# Patient Record
Sex: Male | Born: 1975 | Race: Black or African American | Hispanic: No | Marital: Married | Smoking: Never smoker
Health system: Southern US, Community
[De-identification: ages and names within clinical notes are randomized; demographics above are authoritative.]

## PROBLEM LIST (undated history)

## (undated) DIAGNOSIS — I10 Essential (primary) hypertension: Secondary | ICD-10-CM

## (undated) DIAGNOSIS — I509 Heart failure, unspecified: Secondary | ICD-10-CM

## (undated) DIAGNOSIS — E119 Type 2 diabetes mellitus without complications: Secondary | ICD-10-CM

---

## 2019-08-04 ENCOUNTER — Emergency Department (HOSPITAL_COMMUNITY): Payer: BLUE CROSS/BLUE SHIELD

## 2019-08-04 ENCOUNTER — Inpatient Hospital Stay (HOSPITAL_COMMUNITY)
Admission: EM | Admit: 2019-08-04 | Discharge: 2019-08-11 | DRG: 699 | Disposition: A | Discharge status: Home / Self Care | Payer: BLUE CROSS/BLUE SHIELD | Attending: Internal Medicine | Admitting: Internal Medicine

## 2019-08-04 ENCOUNTER — Inpatient Hospital Stay (HOSPITAL_COMMUNITY): Payer: BLUE CROSS/BLUE SHIELD

## 2019-08-04 ENCOUNTER — Encounter (HOSPITAL_COMMUNITY): Payer: Self-pay | Admitting: Emergency Medicine

## 2019-08-04 ENCOUNTER — Other Ambulatory Visit: Payer: Self-pay

## 2019-08-04 DIAGNOSIS — E669 Obesity, unspecified: Secondary | ICD-10-CM | POA: Diagnosis present

## 2019-08-04 DIAGNOSIS — Z833 Family history of diabetes mellitus: Secondary | ICD-10-CM

## 2019-08-04 DIAGNOSIS — E1121 Type 2 diabetes mellitus with diabetic nephropathy: Principal | ICD-10-CM | POA: Diagnosis present

## 2019-08-04 DIAGNOSIS — E876 Hypokalemia: Secondary | ICD-10-CM | POA: Diagnosis not present

## 2019-08-04 DIAGNOSIS — E8770 Fluid overload, unspecified: Secondary | ICD-10-CM

## 2019-08-04 DIAGNOSIS — I5032 Chronic diastolic (congestive) heart failure: Secondary | ICD-10-CM | POA: Diagnosis present

## 2019-08-04 DIAGNOSIS — I110 Hypertensive heart disease with heart failure: Secondary | ICD-10-CM | POA: Diagnosis present

## 2019-08-04 DIAGNOSIS — Z8249 Family history of ischemic heart disease and other diseases of the circulatory system: Secondary | ICD-10-CM

## 2019-08-04 DIAGNOSIS — I509 Heart failure, unspecified: Secondary | ICD-10-CM

## 2019-08-04 DIAGNOSIS — Z8616 Personal history of COVID-19: Secondary | ICD-10-CM

## 2019-08-04 DIAGNOSIS — I493 Ventricular premature depolarization: Secondary | ICD-10-CM | POA: Diagnosis not present

## 2019-08-04 DIAGNOSIS — E118 Type 2 diabetes mellitus with unspecified complications: Secondary | ICD-10-CM

## 2019-08-04 DIAGNOSIS — N049 Nephrotic syndrome with unspecified morphologic changes: Secondary | ICD-10-CM

## 2019-08-04 DIAGNOSIS — M7989 Other specified soft tissue disorders: Secondary | ICD-10-CM | POA: Diagnosis present

## 2019-08-04 DIAGNOSIS — Z20822 Contact with and (suspected) exposure to covid-19: Secondary | ICD-10-CM | POA: Diagnosis present

## 2019-08-04 DIAGNOSIS — Z6841 Body Mass Index (BMI) 40.0 and over, adult: Secondary | ICD-10-CM | POA: Diagnosis not present

## 2019-08-04 HISTORY — DX: Essential (primary) hypertension: I10

## 2019-08-04 HISTORY — DX: Heart failure, unspecified: I50.9

## 2019-08-04 HISTORY — DX: Type 2 diabetes mellitus without complications: E11.9

## 2019-08-04 LAB — BASIC METABOLIC PANEL
Anion gap: 11 (ref 5–15)
BUN: 14 mg/dL (ref 6–20)
CO2: 25 mmol/L (ref 22–32)
Calcium: 8.5 mg/dL — ABNORMAL LOW (ref 8.9–10.3)
Chloride: 103 mmol/L (ref 98–111)
Creatinine, Ser: 1.3 mg/dL — ABNORMAL HIGH (ref 0.61–1.24)
GFR calc Af Amer: >60 mL/min (ref >60)
GFR calc non Af Amer: >60 mL/min (ref >60)
Glucose, Bld: 259 mg/dL — ABNORMAL HIGH (ref 70–99)
Potassium: 3.5 mmol/L (ref 3.5–5.1)
Sodium: 139 mmol/L (ref 135–145)

## 2019-08-04 LAB — URINALYSIS, ROUTINE W REFLEX MICROSCOPIC
Bacteria, UA: NONE SEEN
Bilirubin Urine: NEGATIVE
Glucose, UA: 50 mg/dL — AB
Ketones, ur: NEGATIVE mg/dL
Leukocytes,Ua: NEGATIVE
Nitrite: NEGATIVE
Protein, ur: >=300 mg/dL — AB
Specific Gravity, Urine: 1.009 (ref 1.005–1.030)
pH: 5 (ref 5.0–8.0)

## 2019-08-04 LAB — SARS CORONAVIRUS 2 (TAT 6-24 HRS): SARS Coronavirus 2: NEGATIVE

## 2019-08-04 LAB — LIPID PANEL
Cholesterol: 240 mg/dL — ABNORMAL HIGH (ref 0–200)
HDL: 48 mg/dL (ref >40)
LDL Cholesterol: 176 mg/dL — ABNORMAL HIGH (ref 0–99)
Total CHOL/HDL Ratio: 5 RATIO
Triglycerides: 79 mg/dL (ref <150)
VLDL: 16 mg/dL (ref 0–40)

## 2019-08-04 LAB — GLUCOSE, CAPILLARY
Glucose-Capillary: 237 mg/dL — ABNORMAL HIGH (ref 70–99)
Glucose-Capillary: 230 mg/dL — ABNORMAL HIGH (ref 70–99)

## 2019-08-04 LAB — CBC
HCT: 40.8 % (ref 39.0–52.0)
Hemoglobin: 12.6 g/dL — ABNORMAL LOW (ref 13.0–17.0)
MCH: 25.3 pg — ABNORMAL LOW (ref 26.0–34.0)
MCHC: 30.9 g/dL (ref 30.0–36.0)
MCV: 81.8 fL (ref 80.0–100.0)
Platelets: 288 K/uL (ref 150–400)
RBC: 4.99 MIL/uL (ref 4.22–5.81)
RDW: 13.2 % (ref 11.5–15.5)
WBC: 8.8 K/uL (ref 4.0–10.5)
nRBC: 0 % (ref 0.0–0.2)

## 2019-08-04 LAB — HEPATIC FUNCTION PANEL
ALT: 45 U/L — ABNORMAL HIGH (ref 0–44)
AST: 30 U/L (ref 15–41)
Albumin: 2.4 g/dL — ABNORMAL LOW (ref 3.5–5.0)
Alkaline Phosphatase: 69 U/L (ref 38–126)
Bilirubin, Direct: 0.1 mg/dL (ref 0.0–0.2)
Indirect Bilirubin: 0.7 mg/dL (ref 0.3–0.9)
Total Bilirubin: 0.8 mg/dL (ref 0.3–1.2)
Total Protein: 6.2 g/dL — ABNORMAL LOW (ref 6.5–8.1)

## 2019-08-04 LAB — PROTEIN / CREATININE RATIO, URINE
Creatinine, Urine: 51.89 mg/dL
Protein Creatinine Ratio: 5.34 mg/mg{Cre} — ABNORMAL HIGH (ref 0.00–0.15)
Total Protein, Urine: 277 mg/dL

## 2019-08-04 LAB — BRAIN NATRIURETIC PEPTIDE: B Natriuretic Peptide: 1264.4 pg/mL — ABNORMAL HIGH (ref 0.0–100.0)

## 2019-08-04 LAB — HIV ANTIBODY (ROUTINE TESTING W REFLEX): HIV Screen 4th Generation wRfx: NONREACTIVE

## 2019-08-04 LAB — ECHOCARDIOGRAM COMPLETE

## 2019-08-04 MED ORDER — ATORVASTATIN CALCIUM 40 MG PO TABS
40.0000 mg | ORAL_TABLET | Freq: Every day | ORAL | Status: DC
  Administered 2019-08-04 – 2019-08-11 (×8): 40 mg via ORAL
  Filled 2019-08-04 (×8): qty 1

## 2019-08-04 MED ORDER — FUROSEMIDE 10 MG/ML IJ SOLN
40.0000 mg | Freq: Every day | INTRAMUSCULAR | Status: DC
  Administered 2019-08-05: 40 mg via INTRAVENOUS
  Filled 2019-08-04: qty 4

## 2019-08-04 MED ORDER — FUROSEMIDE 10 MG/ML IJ SOLN
40.0000 mg | Freq: Once | INTRAMUSCULAR | Status: AC
  Administered 2019-08-04: 40 mg via INTRAVENOUS
  Filled 2019-08-04: qty 4

## 2019-08-04 MED ORDER — INSULIN ASPART 100 UNIT/ML ~~LOC~~ SOLN
0.0000 [IU] | Freq: Three times a day (TID) | SUBCUTANEOUS | Status: DC
  Administered 2019-08-05: 17:00:00 5 [IU] via SUBCUTANEOUS
  Administered 2019-08-05 – 2019-08-07 (×8): 3 [IU] via SUBCUTANEOUS
  Administered 2019-08-08: 5 [IU] via SUBCUTANEOUS
  Administered 2019-08-08: 3 [IU] via SUBCUTANEOUS
  Administered 2019-08-08: 5 [IU] via SUBCUTANEOUS
  Administered 2019-08-09 (×2): 3 [IU] via SUBCUTANEOUS
  Administered 2019-08-09: 5 [IU] via SUBCUTANEOUS
  Administered 2019-08-10: 2 [IU] via SUBCUTANEOUS
  Administered 2019-08-10: 3 [IU] via SUBCUTANEOUS
  Administered 2019-08-11: 5 [IU] via SUBCUTANEOUS
  Administered 2019-08-11: 3 [IU] via SUBCUTANEOUS

## 2019-08-04 MED ORDER — ENOXAPARIN SODIUM 40 MG/0.4ML ~~LOC~~ SOLN
40.0000 mg | SUBCUTANEOUS | Status: DC
  Administered 2019-08-04 – 2019-08-05 (×2): 40 mg via SUBCUTANEOUS
  Filled 2019-08-04 (×2): qty 0.4

## 2019-08-04 MED ORDER — FUROSEMIDE 10 MG/ML IJ SOLN
40.0000 mg | Freq: Two times a day (BID) | INTRAMUSCULAR | Status: DC
  Filled 2019-08-04: qty 4

## 2019-08-04 NOTE — ED Notes (Signed)
Lab called for hepatic function add on

## 2019-08-04 NOTE — ED Provider Notes (Signed)
[facility] EMERGENCY DEPARTMENT Provider Note   CSN: [id number] Arrival date & time: 08/04/19  0216     History Chief Complaint  Patient presents with  . Leg Swelling  . Groin Swelling    Edward Page is a 44 y.o. male.  HPI 44 year old male presents with leg swelling and scrotal swelling.  The leg swelling started about a week ago.  Scrotal swelling started 2 days ago.  He went to [facility] ER where he was discharged with 40 mg Lasix.  Has taken this yesterday and the day before.  Scrotal swelling got worse so he went back to [facility] and was told that his heart was enlarged and had a negative scrotal ultrasound.  However the swelling seems to be worse and he is unable to walk because of the scrotal swelling.  He has been feeling short of breath since he got Covid back in November 2020 but does not think his shortness of breath is particularly worse.  He has a hard time ambulating at baseline but now it is essentially gone because of the swelling.  Chart indicates a history of congestive heart failure but he states that he was told he might have this a couple days ago in the ER but has never had an official diagnosis.   Past Medical History:  Diagnosis Date  . CHF (congestive heart failure) (HCC)   . Diabetes mellitus without complication (HCC)   . Hypertension     There are no problems to display for this patient.   History reviewed. No pertinent surgical history.     No family history on file.  Social History   Tobacco Use  . Smoking status: Never Smoker  . Smokeless tobacco: Never Used  Substance Use Topics  . Alcohol use: Not Currently  . Drug use: Not Currently    Home Medications Prior to Admission medications   Not on File    Allergies    Patient has no known allergies.  Review of Systems   Review of Systems  Respiratory: Positive for shortness of breath.   Cardiovascular: Positive for leg swelling. Negative for chest pain.   Genitourinary: Positive for scrotal swelling.  All other systems reviewed and are negative.   Physical Exam Updated Vital Signs BP (!) 165/99   Pulse 98   Temp 98.3 F (36.8 C) (Oral)   Resp 20   SpO2 98%   Physical Exam Vitals and nursing note reviewed.  Constitutional:      General: He is not in acute distress.    Appearance: He is well-developed. He is obese. He is not ill-appearing or diaphoretic.  HENT:     Head: Normocephalic and atraumatic.     Right Ear: External ear normal.     Left Ear: External ear normal.     Nose: Nose normal.  Eyes:     General:        Right eye: No discharge.        Left eye: No discharge.  Cardiovascular:     Rate and Rhythm: Normal rate and regular rhythm.     Heart sounds: Normal heart sounds.  Pulmonary:     Effort: Pulmonary effort is normal.     Breath sounds: Normal breath sounds.  Abdominal:     Palpations: Abdomen is soft.     Tenderness: There is no abdominal tenderness.  Genitourinary:    Comments: Significant penile and scrotal edema. The glans is visible and external to the swelling Musculoskeletal:  Cervical back: Neck supple.     Right lower leg: Edema present.     Left lower leg: Edema present.     Comments: Diffuse lower extremity pitting edema  Skin:    General: Skin is warm and dry.  Neurological:     Mental Status: He is alert.  Psychiatric:        Mood and Affect: Mood is not anxious.     ED Results / Procedures / Treatments   Labs (all labs ordered are listed, but only abnormal results are displayed) Labs Reviewed  CBC - Abnormal; Notable for the following components:      Result Value   Hemoglobin 12.6 (*)    MCH 25.3 (*)    All other components within normal limits  BASIC METABOLIC PANEL - Abnormal; Notable for the following components:   Glucose, Bld 259 (*)    Creatinine, Ser 1.30 (*)    Calcium 8.5 (*)    All other components within normal limits  BRAIN NATRIURETIC PEPTIDE - Abnormal;  Notable for the following components:   B Natriuretic Peptide 1,264.4 (*)    All other components within normal limits  URINALYSIS, ROUTINE W REFLEX MICROSCOPIC - Abnormal; Notable for the following components:   Glucose, UA 50 (*)    Hgb urine dipstick MODERATE (*)    Protein, ur >=300 (*)    All other components within normal limits  SARS CORONAVIRUS 2 (TAT 6-24 HRS)  HEPATIC FUNCTION PANEL    EKG EKG Interpretation  Date/Time:  Tuesday August 04 2019 02:47:17 EDT Ventricular Rate:  84 PR Interval:  152 QRS Duration: 80 QT Interval:  398 QTC Calculation: 470 R Axis:   36 Text Interpretation: Normal sinus rhythm Nonspecific T wave abnormality Prolonged QT Abnormal ECG No old tracing to compare Confirmed by Pricilla Loveless [phone]) on 08/04/2019 7:57:29 AM   Radiology DG Chest 2 View  Result Date: 08/04/2019 CLINICAL DATA:  Dyspnea, leg swelling. EXAM: CHEST - 2 VIEW COMPARISON:  None. FINDINGS: Patchy bibasilar opacities. No pneumothorax or pleural effusion. Enlarged cardiac silhouette.  Prominent perihilar vessels. No acute osseous abnormality. IMPRESSION: Bibasilar opacities may reflect atelectasis versus edema. Cardiomegaly with prominent perihilar vessels. Electronically Signed   By: Stana Bunting M.D.   On: 08/04/2019 09:29   ECHOCARDIOGRAM COMPLETE  Result Date: 08/04/2019    ECHOCARDIOGRAM REPORT   Patient Name:   Edward Page Date of Exam: 08/04/2019 Medical Rec #:  [id number]      Height:       69.0 in Accession #:    [id number]     Weight:       350.0 lb Date of Birth:  07-18-1975      BSA:          2.620 m Patient Age:    43 years       BP:           159/91 mmHg Patient Gender: M              HR:           96 bpm. Exam Location:  Inpatient Procedure: 2D Echo Indications:    CHF 428  History:        Patient has no prior history of Echocardiogram examinations.                 CHF; Risk Factors:Hypertension and Diabetes.  Sonographer:    Celene Skeen RDCS (AE) Referring  Phys: [id number] [facility] NARENDRA  Sonographer Comments: Patient  is morbidly obese. Image acquisition challenging due to patient body habitus. limited mobility IMPRESSIONS  1. Left ventricular ejection fraction, by estimation, is 50 to 55%. The left ventricle has low normal function. The left ventricle demonstrates global hypokinesis. The left ventricular internal cavity size was mildly dilated. There is mild concentric left ventricular hypertrophy. Left ventricular diastolic parameters are consistent with Grade II diastolic dysfunction (pseudonormalization). Elevated left atrial pressure.  2. Right ventricular systolic function is normal. The right ventricular size is normal. There is moderately elevated pulmonary artery systolic pressure. The estimated right ventricular systolic pressure is 51.0 mmHg.  3. Left atrial size was mildly dilated.  4. The mitral valve is normal in structure. Trivial mitral valve regurgitation.  5. The aortic valve is normal in structure. Aortic valve regurgitation is not visualized.  6. The inferior vena cava is dilated in size with <50% respiratory variability, suggesting right atrial pressure of 15 mmHg. FINDINGS  Left Ventricle: Left ventricular ejection fraction, by estimation, is 50 to 55%. The left ventricle has low normal function. The left ventricle demonstrates global hypokinesis. The left ventricular internal cavity size was mildly dilated. There is mild concentric left ventricular hypertrophy. Left ventricular diastolic parameters are consistent with Grade II diastolic dysfunction (pseudonormalization). Elevated left atrial pressure. Right Ventricle: The right ventricular size is normal. No increase in right ventricular wall thickness. Right ventricular systolic function is normal. There is moderately elevated pulmonary artery systolic pressure. The tricuspid regurgitant velocity is 3.00 m/s, and with an assumed right atrial pressure of 15 mmHg, the estimated right ventricular  systolic pressure is 51.0 mmHg. Left Atrium: Left atrial size was mildly dilated. Right Atrium: Right atrial size was normal in size. Pericardium: Trivial pericardial effusion is present. The pericardial effusion is posterior to the left ventricle. Mitral Valve: The mitral valve is normal in structure. Trivial mitral valve regurgitation. Tricuspid Valve: The tricuspid valve is normal in structure. Tricuspid valve regurgitation is mild. Aortic Valve: The aortic valve is normal in structure. Aortic valve regurgitation is not visualized. Pulmonic Valve: The pulmonic valve was normal in structure. Pulmonic valve regurgitation is not visualized. Aorta: The aortic root and ascending aorta are structurally normal, with no evidence of dilitation. Venous: The inferior vena cava is dilated in size with less than 50% respiratory variability, suggesting right atrial pressure of 15 mmHg. IAS/Shunts: No atrial level shunt detected by color flow Doppler.  LEFT VENTRICLE PLAX 2D LVIDd:         6.10 cm  Diastology LVIDs:         4.60 cm  LV e' lateral:   9.68 cm/s LV PW:         1.36 cm  LV E/e' lateral: 12.0 LV IVS:        1.20 cm  LV e' medial:    7.51 cm/s LVOT diam:     2.30 cm  LV E/e' medial:  15.4 LV SV:         72 LV SV Index:   28 LVOT Area:     4.15 cm  RIGHT VENTRICLE RV S prime:     10.70 cm/s TAPSE (M-mode): 2.3 cm LEFT ATRIUM           Index       RIGHT ATRIUM           Index LA diam:      4.40 cm 1.68 cm/m  RA Area:     15.10 cm LA Vol (A4C): 61.4 ml 23.41 ml/m RA Volume:  34.40 ml  13.13 ml/m  AORTIC VALVE LVOT Vmax:   101.00 cm/s LVOT Vmean:  71.000 cm/s LVOT VTI:    0.174 m  AORTA Ao Root diam: 3.50 cm MITRAL VALVE                TRICUSPID VALVE MV Area (PHT): 3.85 cm     TR Peak grad:   36.0 mmHg MV Decel Time: 197 msec     TR Vmax:        300.00 cm/s MV E velocity: 116.00 cm/s                             SHUNTS                             Systemic VTI:  0.17 m                             Systemic Diam:  2.30 cm Dani Gobble Croitoru MD Electronically signed by Sanda Klein MD Signature Date/Time: 08/04/2019/1:59:21 PM    Final     Procedures Procedures (including critical care time)  Medications Ordered in ED Medications  furosemide (LASIX) injection 40 mg (has no administration in time range)    ED Course  I have reviewed the triage vital signs and the nursing notes.  Pertinent labs & imaging results that were available during my care of the patient were reviewed by me and considered in my medical decision making (see chart for details).    MDM Rules/Calculators/A&P                       Patient is not in distress but does have pretty significant peripheral edema.  There is also probably some pulmonary edema as seen on chest x-ray.  He is not hypoxic.  However given the severe degree of how much edema he has I think you will need diuresis intravenously.  Labs were reviewed and do show significant BNP elevation.  Will admit for diuresis. Final Clinical Impression(s) / ED Diagnoses Final diagnoses:  Acute congestive heart failure, unspecified heart failure type [facility])    Rx / DC Orders ED Discharge Orders    None       Sherwood Gambler, MD 08/05/19 [phone]

## 2019-08-04 NOTE — ED Notes (Signed)
Patient transported to XR. 

## 2019-08-04 NOTE — ED Notes (Signed)
Lunch Tray Ordered @ 1033. 

## 2019-08-04 NOTE — H&P (Addendum)
Date: 08/04/2019               Patient Name:  Edward Page MRN: [id number]  DOB: 04-28-1975 Age / Sex: 44 y.o., male   PCP: Andres Shad, MD              Medical Service: Internal Medicine Teaching Service              Attending Physician: Dr. Aldine Contes, MD    First Contact: Carolyne Fiscal, [place], [place] Pager: [phone]  Second Contact: Dr. Gilberto Better Pager: [phone]       After Hours (After 5p/  First Contact Pager: [phone]  weekends / holidays): Second Contact Pager: [phone]   Chief Complaint: "Scrotal swelling"  History of Present Illness:  [name] is a 44 year old man with a past medical history of hypertension and diabetes presenting to the [facility] ED with a two day history of scrotal swelling and pain in the context of a two week history of progressive BLE swelling and shortness of breath. [name] has never experiences anything like this before. Of note, patient presented to the [facility] ED two days prior with same swelling/pain and was discharged home with Lasix 40 mg PO once daily and presents now with no noticeable improvement on this regimen.  On review of systems, [name] endorses chronic DOE and dry cough which he has experienced since he was diagnosed with COVID-19 ~4 months ago. However, over the past month he reports worsening DOE and now is unable to walk more than 10-20 feet before being limited by shortness of breath. Denies fever, chills, nausea, vomiting, constipation, diarrhea, dysuria, chest pain, palpitations, numbness/tingling, vision changes, and headache.   ED Course: Upon arrival to the ED, patient was afebrile (Tmax 98.3), BP 158/90, HR 87, RR 17, and SpO2 95% on room air. Notable labs from ED workup include BNP elevated to 1264 and UA demonstrating severe proteinuria. Total protein 6.2 with albumin 2.4. WBC 8.8, Hg 12.6, BG 259, Cr 1.30, BUN 14. CXR demonstrated cardiomegaly with prominent perihilar vessels and bibasilar  opacities. He was given Lasix 40 mg IV.  Meds: Current Meds  Medication Sig  . furosemide (LASIX) 40 MG tablet Take 40 mg by mouth daily.  . metFORMIN (GLUCOPHAGE) 500 MG tablet Take 500 mg by mouth 2 (two) times daily.  . metoprolol tartrate (LOPRESSOR) 25 MG tablet Take 25 mg by mouth 2 (two) times daily.     Allergies: Allergies as of 08/04/2019  . (No Known Allergies)   Past Medical History:  Diagnosis Date  . CHF (congestive heart failure) ([place])   . Diabetes mellitus without complication ([place])   . Hypertension     Family History: Patient describes a history of HTN and DM in his mother and father.  Social History: Patient lives in an apartment in [place], [place] with his wife. Per patient, he has access to adequate social support.  Review of Systems: A complete ROS was negative except as per HPI.  Physical Exam: Blood pressure (!) 159/91, pulse 96, temperature 98.3 F (36.8 C), temperature source Oral, resp. rate 18, SpO2 98 %. Constitutional: Patient resting comfortably in bed in ED upon approach. Alert, cooperative, and appears stated age. Requests bedside urinal during interview. Head: Normocephalic and atraumatic. Eyes: EOM grossly intact. Anicteric.  Cardiovascular: Normal rate and rhythm. Normal S1, S2 with no murmurs, rubs, or gallops. Anasarca to level of mid-abdomen. Pulmonary: Normal work of breathing. Bibasilar crackles present.  Abdomen:  Obese habitus limits exam. Normal bowel sounds, non-tender, no rebound or guarding. Not distended. Extremities: 3-4+ BLE pitting edema. No edema noted in BUE. Pulses: 2+ pulses throughout Skin: Warm, dry. Several papules over eyelid and forehead area. Otherwise no rashes noted. Neuro: Alert and oriented x3. No focal neurologic deficits detected on exam. Psych: Mood and affect normal and congruent.  EKG: personally reviewed my interpretation is NSR. No concerning signs of ischemia with possible QT prolongation.  CXR:  personally reviewed my interpretation is cardiomegaly with prominent perihilar vessels with bibasilar opacities most likely reflecting edema given clinical context.  Assessment & Plan by Problem: Active Problems:   Acute heart failure [facility])  Summary: [name] is a 44 year old man with a past medical history of hypertension and diabetes who presents to the ED with a two week history of bilateral leg swelling and dyspnea on exertion and a two day history of scrotal swelling and pain.  #Fluid Overload #Acute Heart Failure Exacerbation vs Nephrotic Syndrome Patient presents with two week history of progressive DOE with BLE and scrotal swelling consistent with third-spacing and fluid overload. Patient has remained afebrile and HDS in ED. Physical exam reveals anasarca to level of mid-abdomen with marked scrotal edema. Pertinent lab findings include BNP elevated to 1264, LFTs with total protein 6.2 and albumin 2.4, UA revealing marked proteinuria of >300 mg/dL, and urine protein/creatinine ratio 5.34. CXR reveals findings significant for acute heart failure including cardiomegaly with pulmonary edema and pulmonary vascular congestion. Per patient, at outside ED echo demonstrated a "big heart". Patient presentation is consistent with elements of heart failure exacerbation given fluid overload, BNP elevated to 1264, and CXR. Patient presentation is also consistent with the nephrotic syndrome given heavy proteinuria and bland UA with anasarca. The differential for this presentation includes causes of acute CHF exacerbation, causes of the nephrotic syndrome, and potential unifying etiologies. Must rule out sequelae of infectious process such as HIV/HCV/HBV, syphilis, or other bacteria but this is less likely given CBC with no leukocytosis, LFTs, no fever, and reassuring vitals as well as no recent history of viral illness or sick contacts. Also consider ischemic process; DM is a risk factor, but patient  age, ROS, and rapid progression make this less likely. Possible unifying etiologies include infiltrative processes such as amyloidosis. These are supported by explaining renal and cardiac manifestations, patient demographic as young AAM, but are less likely considering timeline and low incidence. Further workup is required to assess for a common underlying mechanism and/or isolated cardiac/renal pathology. Plan: -Lasix 40 mg IV bid -Follow BMP and CBC -Daily weights -Cardiac monitoring -F/u echocardiogram -F/u lipid panel -F.u A1c -F/u infiltrative workup: light chains, immunofixation electrophoresis, UPEP/UIFE -F.u HIV Ab   #T2DM Patient reports being well-controlled on home regimen of Metformin 500 mg BID. On admission, BMP with BG of 259. No signs of symptomatic hyperglycemia. -Hold home metformin -SSI -Frequent BG checks -Start atorvastatin 40 mg PO daily  #HTN Patient reports being well-controlled on home regimen of metoprolol tartrate 25 mg bid. Pressures have remained normal/soft arriving to ED. -Hold home regimen  Diet: Heart healthy/carb modified Code: Full DVT ppx: Lovenox Dispo: Admit patient to Observation with expected length of stay less than 2 midnights.  Signed: Reinaldo Meeker, Medical Student 08/04/2019, 11:26 AM    Attestation for Student Documentation:  I personally was present and performed or re-performed the history, physical exam and medical decision-making activities of this service and have verified that the service and findings  are accurately documented in the student's note with the following addition.  Briefly, Schneur Crowson is a 44 yo M/ w/ a PMHx notable HTN and DMII who presented with anasarca. The scrotal edema was associated with pain which prompted his visit today. He denied a prior history of edema. His presentation is concerning for CHF/nephrotic syndrome given his increased BNP, hypoalbuminemia, anasarca, proteinuria, with a P/Cr ratio of  5.34 (calcuated to roughly 10g/24hr), and enlarged hear. Echo demonstrated global hypokinesis with GIDD, LVEF 50-55%. Patients BP dropped with diuresis to WNL's.  Given the combination of his diastolic heart failure and renal failure there is concern for AL amyloidosis in addition to myocarditis, ischemic cardiomyopathy, vs DM/HTN induced cardiomyopathy.  He warranted additional evaluation and treatment in the inpatient setting given the above.   Lanelle Bal, MD 08/04/2019, 2:18 PM

## 2019-08-04 NOTE — ED Triage Notes (Signed)
BIB EMS from home. Reports bilateral leg swelling/genital swelling X1 week. States he takes Lasix 40mg  once per day.

## 2019-08-04 NOTE — Progress Notes (Signed)
  Echocardiogram 2D Echocardiogram has been performed.  Edward Page 08/04/2019, 1:34 PM

## 2019-08-05 ENCOUNTER — Inpatient Hospital Stay (HOSPITAL_COMMUNITY): Payer: BLUE CROSS/BLUE SHIELD

## 2019-08-05 DIAGNOSIS — E876 Hypokalemia: Secondary | ICD-10-CM

## 2019-08-05 LAB — CBC
HCT: 37 % — ABNORMAL LOW (ref 39.0–52.0)
Hemoglobin: 11.7 g/dL — ABNORMAL LOW (ref 13.0–17.0)
MCH: 25.4 pg — ABNORMAL LOW (ref 26.0–34.0)
MCHC: 31.6 g/dL (ref 30.0–36.0)
MCV: 80.3 fL (ref 80.0–100.0)
Platelets: 239 10*3/uL (ref 150–400)
RBC: 4.61 MIL/uL (ref 4.22–5.81)
RDW: 12.8 % (ref 11.5–15.5)
WBC: 6.4 10*3/uL (ref 4.0–10.5)
nRBC: 0 % (ref 0.0–0.2)

## 2019-08-05 LAB — PROTEIN ELECTROPHORESIS, SERUM
A/G Ratio: 0.7 (ref 0.7–1.7)
Albumin ELP: 2.3 g/dL — ABNORMAL LOW (ref 2.9–4.4)
Alpha-1-Globulin: 0.3 g/dL (ref 0.0–0.4)
Alpha-2-Globulin: 0.9 g/dL (ref 0.4–1.0)
Beta Globulin: 1.2 g/dL (ref 0.7–1.3)
Gamma Globulin: 0.8 g/dL (ref 0.4–1.8)
Globulin, Total: 3.1 g/dL (ref 2.2–3.9)
Total Protein ELP: 5.4 g/dL — ABNORMAL LOW (ref 6.0–8.5)

## 2019-08-05 LAB — BASIC METABOLIC PANEL
Anion gap: 7 (ref 5–15)
BUN: 10 mg/dL (ref 6–20)
CO2: 30 mmol/L (ref 22–32)
Calcium: 8.1 mg/dL — ABNORMAL LOW (ref 8.9–10.3)
Chloride: 103 mmol/L (ref 98–111)
Creatinine, Ser: 1.16 mg/dL (ref 0.61–1.24)
GFR calc Af Amer: >60 mL/min (ref >60)
GFR calc non Af Amer: >60 mL/min (ref >60)
Glucose, Bld: 205 mg/dL — ABNORMAL HIGH (ref 70–99)
Potassium: 3 mmol/L — ABNORMAL LOW (ref 3.5–5.1)
Sodium: 140 mmol/L (ref 135–145)

## 2019-08-05 LAB — KAPPA/LAMBDA LIGHT CHAINS
Kappa free light chain: 44.2 mg/L — ABNORMAL HIGH (ref 3.3–19.4)
Kappa, lambda light chain ratio: 1.44 (ref 0.26–1.65)
Lambda free light chains: 30.6 mg/L — ABNORMAL HIGH (ref 5.7–26.3)

## 2019-08-05 LAB — GLUCOSE, CAPILLARY
Glucose-Capillary: 185 mg/dL — ABNORMAL HIGH (ref 70–99)
Glucose-Capillary: 191 mg/dL — ABNORMAL HIGH (ref 70–99)
Glucose-Capillary: 191 mg/dL — ABNORMAL HIGH (ref 70–99)

## 2019-08-05 LAB — HEMOGLOBIN A1C
Hgb A1c MFr Bld: 12.1 % — ABNORMAL HIGH (ref 4.8–5.6)
Mean Plasma Glucose: 301 mg/dL

## 2019-08-05 MED ORDER — FUROSEMIDE 10 MG/ML IJ SOLN
80.0000 mg | Freq: Two times a day (BID) | INTRAMUSCULAR | Status: DC
  Administered 2019-08-05 – 2019-08-09 (×9): 80 mg via INTRAVENOUS
  Filled 2019-08-05 (×10): qty 8

## 2019-08-05 MED ORDER — POTASSIUM CHLORIDE CRYS ER 20 MEQ PO TBCR
40.0000 meq | EXTENDED_RELEASE_TABLET | Freq: Two times a day (BID) | ORAL | Status: DC
  Administered 2019-08-06 – 2019-08-11 (×11): 40 meq via ORAL
  Filled 2019-08-05 (×11): qty 2

## 2019-08-05 MED ORDER — INSULIN STARTER KIT- PEN NEEDLES (ENGLISH)
1.0000 | Freq: Once | Status: DC
  Filled 2019-08-05: qty 1

## 2019-08-05 MED ORDER — LIVING WELL WITH DIABETES BOOK
Freq: Once | Status: DC
  Filled 2019-08-05: qty 1

## 2019-08-05 MED ORDER — POTASSIUM CHLORIDE CRYS ER 20 MEQ PO TBCR
40.0000 meq | EXTENDED_RELEASE_TABLET | ORAL | Status: AC
  Administered 2019-08-05 (×2): 40 meq via ORAL
  Filled 2019-08-05 (×2): qty 2

## 2019-08-05 MED ORDER — ENOXAPARIN SODIUM 80 MG/0.8ML ~~LOC~~ SOLN
80.0000 mg | SUBCUTANEOUS | Status: AC
  Administered 2019-08-06 – 2019-08-08 (×3): 80 mg via SUBCUTANEOUS
  Filled 2019-08-05 (×3): qty 0.8

## 2019-08-05 MED ORDER — ENOXAPARIN SODIUM 40 MG/0.4ML ~~LOC~~ SOLN
40.0000 mg | Freq: Once | SUBCUTANEOUS | Status: AC
  Administered 2019-08-05: 40 mg via SUBCUTANEOUS
  Filled 2019-08-05: qty 0.4

## 2019-08-05 MED ORDER — LISINOPRIL 20 MG PO TABS
20.0000 mg | ORAL_TABLET | Freq: Every day | ORAL | Status: DC
  Administered 2019-08-05 – 2019-08-11 (×7): 20 mg via ORAL
  Filled 2019-08-05 (×7): qty 1

## 2019-08-05 NOTE — Progress Notes (Signed)
Date: 08/05/2019  Patient name: Edward Page  Medical record number: [id number]  Date of birth: [birth year]   I have seen and evaluated Edward Page and discussed their care with the Residency Team.  In brief, patient is a 45 year old male with a past medical history of hypertension, type 2 diabetes who presented to the ED with progressively worsening swelling of his legs over the last 2 weeks.  Patient states that over the last couple weeks he has noted progressive swelling of his lower extremities as well as scrotal swelling and pain with associated shortness of breath.  Patient dates that his shortness of breath has been progressive as well and now he is unable to walk more than 10 to 20 feet before becoming short of breath.  Patient went to the [facility] emergency room 2 days prior to his admission and was discharged home with Lasix 40 mg daily but had no improvement with this regimen.  No fevers or chills, no chest pain, no palpitations, diaphoresis, no headache, no blurry vision, no tingling or numbness, no abdominal pain, no nausea or vomiting, no diarrhea.  Today patient states that he feels about the same but states that he is urinating a lot with the Lasix.  PMHx, Fam Hx, and/or Soc Hx : As per resident admit note  Vitals:   08/05/19 0508 08/05/19 0721  BP: (!) 148/86 (!) 157/96  Pulse: 95 92  Resp: 20 17  Temp: 98.2 F (36.8 C) 98.2 F (36.8 C)  SpO2: 100% 100%   General: Awake, alert, oriented x3, NAD CVS: Regular rate and rhythm, normal heart sounds Lungs: Clear to auscultation bilaterally Abdomen: Soft, nontender, nondistended, normoactive bowel sounds Extremities: 2-3+ bilateral lower extremity pitting edema noted, abdominal wall pitting edema noted as well, nontender to palpation Psych: Normal mood and affect Neuro: Alert and oriented x3, able to move all 4 extremities, no focal deficit noted HEENT: Normocephalic, atraumatic  Assessment and Plan: I have seen and  evaluated the patient as outlined above. I agree with the formulated Assessment and Plan as detailed in the residents' note, with the following changes:   1.  Fluid overload likely secondary to nephrotic syndrome: - Patient presented to ED with progressive bilateral lower extremity edema associated with shortness of breath/dyspnea on exertion and was found to have an elevated BNP of 2264, chest x-ray showing pulmonary edema as well as hypoalbuminemia of 2.4.  Urine protein creatinine ratio was 5.34 consistent with nephrotic syndrome. -2D echo results noted.  Patient does have a mildly reduced EF of 50 to 55% with left ventricular hypokinesis. -Patient with significant proteinuria consistent with nephrotic syndrome.  Would obtain nephrology consult.  It is possible that given the patient has uncontrolled diabetes that this could be secondary to diabetic nephropathy but given the sudden onset of his symptoms as well as a reversal of his A/G ratio I am concerned for a possible paraproteinemia.  We will follow-up IFE, SPEP, kappa lambda levels.  Would also check quantitative immunoglobulin levels.  Given patient has possible cardiac involvement would need to rule out amyloidosis. -Would also need to rule out infectious etiology.  Patient is HIV negative but will need to check hepatitis panel as well -We will continue with IV diuresis with Lasix 40 mg IV.  Patient was net negative approximately 3 L over the last 24 hours.  We will continue to monitor strict I's and O's. -Of note, patient has uncontrolled diabetes with an A1c of 12.1 here.  We will continue to  monitor blood sugars here on current regimen -No further work-up at this time.  We will continue to monitor closely.   Aldine Contes, MD 4/14/202110:51 AM

## 2019-08-05 NOTE — Progress Notes (Signed)
   Subjective:  Patient examined and evaluated at bedside. [name] reports continued scrotal discomfort from swelling and little/no subjective improvement in swelling in legs/abdomen. He denies any shortness of breath or abdominal pain and has no additional complaints. Team discussed plan for continued diuresis and nephrology consult for possible nephrotic syndrome, and patient is agreeable.  Objective:  Vital signs in last 24 hours: Vitals:   08/04/19 2001 08/05/19 0034 08/05/19 0508 08/05/19 0721  BP:  (!) 143/84 (!) 148/86 (!) 157/96  Pulse:  99 95 92  Resp:  18 20 17   Temp:  98.5 F (36.9 C) 98.2 F (36.8 C) 98.2 F (36.8 C)  TempSrc:   Oral Oral  SpO2: 100% 94% 100% 100%  Weight:   (!) 165.5 kg   Height:       Weight change:   Intake/Output Summary (Last 24 hours) at 08/05/2019 1023 Last data filed at 08/05/2019 1014 Gross per 24 hour  Intake 940 ml  Output 4285 ml  Net -3345 ml   General appearance: alert, cooperative, appears stated age and in no apparent distress Head: Normocephalic, without obvious abnormality, atraumatic Lungs: Normal work of breathing. Interval improvement with no bibasilar carckled noted on exam Heart: regular rate and rhythm, S1, S2 normal, no murmur, click, rub or gallop and no abdominal wall edema noted on exam Abdomen: soft, non-tender; bowel sounds normal; no masses,  no organomegaly and exam limited by obese habitus Male genitalia: Marked swelling noted in scrotum Extremities: 3-4+ BLE pitting edema. BUE wnl Neurologic: Grossly normal Psych: Mood and affect normal   Assessment/Plan:  Active Problems:   Acute heart failure [facility])  Summary: Edward Page is a 44 year old man with a past medical history of hypertension and diabetes who presents to the ED with a two week history of bilateral leg swelling and dyspnea on exertion and a two day history of scrotal swelling and pain.  #Fluid Overload #Acute Heart Failure Exacerbation vs  Nephrotic Syndrome Patient denies shortness of breath today with no subjective improvement in scrotal swelling/discomfort. On exam, edema in abdominal wall has resolved and no crackles heard at B lung bases. Patient net negative >3L fluid after initiation of diuresis with Lasix. Echo demonstrated EF of 50-55% with LV global hypokinesis which, with elevated BNP, supports heart failure as contributing to fluid overload. Urine protein creatinine ratio of 5.34 and calculated ~10g/day proteinuria support contributions from nephrotic syndrome vs diabetic nephropathy. This is also made more likely by history of DM and HgA1c of 12.1%. Nephrotic workup including amyloid labs pending. Our Nephrology colleagues have been consulted on this case, and we greatly appreciate their evaluation and recommendations. -F/u Nephrology consult -F/u nephrotic syndrome w/u labs -Continue Lasix 40 gm IV daily -Monitor BMP and correct hypokalemia, as below -Ensure adequate control of BP and BG  #T2DM Home regimen includes Metformin 500 mg bid monotherapy. BG 185 this a.m. on SSI. HgA1c found to be 12.1%. -SSI -Frequent BG checks -Carb modified and heart healthy diet -Continue atorvastatin  #HTN Home regimen includes metoprolol tartrate 25 mg bid. Pressures have remained adequately controlled with diuresis -Hold home regimen  #Hypokalemia Labs this a.m. show potassium of 3.0, most likely 2/2 Lasix. Patient denies symptoms of hypokalemia. -Oral repletion with KCl PO q4h  Diet: Heart healthy/Carb modified DVT ppx: Lovenox Dispo: Pending clinical improvement    LOS: 1 day   55, Medical Student 08/05/2019, 10:23 AM

## 2019-08-05 NOTE — Progress Notes (Signed)
Pt has a BMI of 54. D/w medical student Illene Regulus and we will increase his DVT prophylaxis dose to 0.5mg /kg/day.  Scr 1.16  Ulyses Southward, PharmD, [place], AAHIVP, CPP Infectious Disease Pharmacist 08/05/2019 1:41 PM

## 2019-08-05 NOTE — Progress Notes (Signed)
Inpatient Diabetes Program Recommendations  AACE/ADA: New Consensus Statement on Inpatient Glycemic Control (2015)  Target Ranges:  Prepandial:   less than 140 mg/dL      Peak postprandial:   less than 180 mg/dL (1-2 hours)      Critically ill patients:  140 - 180 mg/dL   Lab Results  Component Value Date   GLUCAP 191 (H) 08/05/2019   HGBA1C 12.1 (H) 08/04/2019    Review of Glycemic Control Results for KYLOR, VALVERDE (MRN [id number]) as of 08/05/2019 13:08  Ref. Range 08/04/2019 16:14 08/04/2019 21:21 08/05/2019 06:05 08/05/2019 11:13  Glucose-Capillary Latest Ref Range: 70 - 99 mg/dL 237 (H) 230 (H) 185 (H) 191 (H)  Results for REX, OESTERLE (MRN [id number]) as of 08/05/2019 13:08  Ref. Range 08/04/2019 10:44  Hemoglobin A1C Latest Ref Range: 4.8 - 5.6 % 12.1 (H)    Diabetes history: DM2 Outpatient Diabetes medications: Metformin 500 mg BID  Current orders for Inpatient glycemic control: Novolog 0-15 units TID   Note:  Spoke with patient at bedside.  Reviewed patient's current A1c of 12.1% (average blood sugar of 355m/dl). Explained what a A1c is and what it measures. Also reviewed goal A1c with patient, importance of good glucose control @ home, and blood sugar goals.  He states he does not drink sugary drinks.  Mainly sticks to [employer] Technologiesand water.  His struggle is bread and pastas.  Reviewed The Plate Method.  Encouraged whole wheat pasta and bread.  He does not check his blood sugar but does have a glucometer.  Encouraged him to check CBG ay least 2 times a day and bring meter to PCP appointment for review.  [place].  He had an appointment for Monday 12th and was not able to make it due to his scrotal swelling.  He states he will make a follow up appointment for 1-2 weeks after discharge.  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  Please allow him to self administer insulin.   Ordered LWWD booklet, insulin pen starter kit and attached education to AVS.  Will continue to follow while inpatient.  Thank you, JReche Dixon RN, BSN Diabetes Coordinator Inpatient Diabetes Program [phone](team pager from 8a-5p)

## 2019-08-05 NOTE — Consult Note (Signed)
[facility] Renal Consultation Note  Requesting MD: Earlie Raveling Indication for Consultation: nephrotic syndrome  HPI:  Edward Page is a 44 y.o. male with past medical history significant for diabetes and hypertension although patient is pretty good as far as how long he has had these illnesses.  He estimates possibly 5 years for both.  He admits that he has not been taking care of himself as he should.  He has fallen off the wagon as far as taking his medications.  He indicates that he always has a little bit of swelling.  However, over the last couple of weeks his swelling has intensified and then it began to develop in his groin area as well as his abdominal wall.  He presented to an emergency department and was given oral Lasix 40 mg.  This did not have clinical impact so presented to the emergency department here on 4/13 with these complaints.  He was found to be massively volume overloaded with fluid up to his mid abdominal wall.  Other work-up showed creatinine of 1.3, albumin 2.4, and protein to creatinine ratio indicating over 5 g of proteinuria.  He was given 1 dose of Lasix 40 mg IV and already can tell a difference.  Urine also does show some microscopic hematuria with 11-20 RBCs per high-power field.  Patient really denies any urinary symptoms.  He has never seen blood in his urine.  He denies history of kidney stones, frequent urinary tract infections.  He has used NSAIDs in the past but not to an excessive degree.  There is no family history of kidney disease.  Hepatitis serologies as well as SPEP are pending.  HIV was negative and hemoglobin A1c was over 12  Creatinine, Ser  Date/Time Value Ref Range Status  08/05/2019 04:45 AM 1.16 0.61 - 1.24 mg/dL Final  39/76/7341 93:79 AM 1.30 (H) 0.61 - 1.24 mg/dL Final     PMHx:   Past Medical History:  Diagnosis Date  . CHF (congestive heart failure) (HCC)   . Diabetes mellitus without complication (HCC)   . Hypertension      History reviewed. No pertinent surgical history.  Family Hx: No family history on file.  Social History:  reports that he has never smoked. He has never used smokeless tobacco. He reports previous alcohol use. He reports previous drug use.  Allergies: No Known Allergies  Medications: Prior to Admission medications   Medication Sig Start Date End Date Taking? Authorizing Provider  furosemide (LASIX) 40 MG tablet Take 40 mg by mouth daily. 08/03/19  Yes [provider]  metFORMIN (GLUCOPHAGE) 500 MG tablet Take 500 mg by mouth 2 (two) times daily. 08/03/19  Yes [provider]  metoprolol tartrate (LOPRESSOR) 25 MG tablet Take 25 mg by mouth 2 (two) times daily. 08/03/19  Yes [provider]    I have reviewed the patient's current medications.  Labs:  Results for orders placed or performed during the hospital encounter of 08/04/19 (from the past 48 hour(s))  CBC     Status: Abnormal   Collection Time: 08/04/19  2:46 AM  Result Value Ref Range   WBC 8.8 4.0 - 10.5 K/uL   RBC 4.99 4.22 - 5.81 MIL/uL   Hemoglobin 12.6 (L) 13.0 - 17.0 g/dL   HCT 02.4 09.7 - 35.3 %   MCV 81.8 80.0 - 100.0 fL   MCH 25.3 (L) 26.0 - 34.0 pg   MCHC 30.9 30.0 - 36.0 g/dL   RDW 29.9 24.2 -  15.5 %   Platelets 288 150 - 400 K/uL   nRBC 0.0 0.0 - 0.2 %    Comment: Performed at [facility], [address] [place], [place] [address]  Basic metabolic panel     Status: Abnormal   Collection Time: 08/04/19  2:46 AM  Result Value Ref Range   Sodium 139 135 - 145 mmol/L   Potassium 3.5 3.5 - 5.1 mmol/L   Chloride 103 98 - 111 mmol/L   CO2 25 22 - 32 mmol/L   Glucose, Bld 259 (H) 70 - 99 mg/dL    Comment: Glucose reference range applies only to samples taken after fasting for at least 8 hours.   BUN 14 6 - 20 mg/dL   Creatinine, Ser 2.11 (H) 0.61 - 1.24 mg/dL   Calcium 8.5 (L) 8.9 - 10.3 mg/dL   GFR calc non Af Amer >60 >60 mL/min   GFR calc Af Amer >60 >60 mL/min   Anion  gap 11 5 - 15    Comment: Performed at [facility], [address] [place], [place] [address]  Brain natriuretic peptide     Status: Abnormal   Collection Time: 08/04/19  2:46 AM  Result Value Ref Range   B Natriuretic Peptide 1,264.4 (H) 0.0 - 100.0 pg/mL    Comment: Performed at [facility], [address], [place], [place] [address]  Urinalysis, Routine w reflex microscopic     Status: Abnormal   Collection Time: 08/04/19  3:00 AM  Result Value Ref Range   Color, Urine YELLOW YELLOW   APPearance CLEAR CLEAR   Specific Gravity, Urine 1.009 1.005 - 1.030   pH 5.0 5.0 - 8.0   Glucose, UA 50 (A) NEGATIVE mg/dL   Hgb urine dipstick MODERATE (A) NEGATIVE   Bilirubin Urine NEGATIVE NEGATIVE   Ketones, ur NEGATIVE NEGATIVE mg/dL   Protein, ur >=818 (A) NEGATIVE mg/dL   Nitrite NEGATIVE NEGATIVE   Leukocytes,Ua NEGATIVE NEGATIVE   RBC / HPF 11-20 0 - 5 RBC/hpf   WBC, UA 0-5 0 - 5 WBC/hpf   Bacteria, UA NONE SEEN NONE SEEN   Squamous Epithelial / LPF 0-5 0 - 5   Mucus PRESENT    Hyaline Casts, UA PRESENT     Comment: Performed at [facility], [address] [place], [place] [address]  Protein / creatinine ratio, urine     Status: Abnormal   Collection Time: 08/04/19  3:00 AM  Result Value Ref Range   Creatinine, Urine 51.89 mg/dL   Total Protein, Urine 277 mg/dL    Comment: NO NORMAL RANGE ESTABLISHED FOR THIS TEST RESULTS CONFIRMED BY MANUAL DILUTION    Protein Creatinine Ratio 5.34 (H) 0.00 - 0.15 mg/mg[Cre]    Comment: Performed at [facility], [address] [place], [place] [address]  Hepatic function panel     Status: Abnormal   Collection Time: 08/04/19  8:08 AM  Result Value Ref Range   Total Protein 6.2 (L) 6.5 - 8.1 g/dL   Albumin 2.4 (L) 3.5 - 5.0 g/dL   AST 30 15 - 41 U/L   ALT 45 (H) 0 - 44 U/L   Alkaline Phosphatase 69 38 - 126 U/L   Total Bilirubin 0.8 0.3 - 1.2 mg/dL   Bilirubin, Direct 0.1 0.0 - 0.2 mg/dL   Indirect Bilirubin  0.7 0.3 - 0.9 mg/dL    Comment: Performed at [facility], [address],  [place], [place] [address]  SARS CORONAVIRUS 2 (TAT 6-24 HRS) Nasopharyngeal Nasopharyngeal Swab     Status: None   Collection Time: 08/04/19  8:45 AM   Specimen: Nasopharyngeal Swab  Result Value Ref Range   SARS Coronavirus 2 NEGATIVE NEGATIVE    Comment: (NOTE) SARS-CoV-2 target nucleic acids are NOT DETECTED. The SARS-CoV-2 RNA is generally detectable in upper and lower respiratory specimens during the acute phase of infection. Negative results do not preclude SARS-CoV-2 infection, do not rule out co-infections with other pathogens, and should not be used as the sole basis for treatment or other patient management decisions. Negative results must be combined with clinical observations, patient history, and epidemiological information. The expected result is Negative. Fact Sheet for Patients: HairSlick.no Fact Sheet for Healthcare Providers: quierodirigir.com This test is not yet approved or cleared by the [place] FDA and  has been authorized for detection and/or diagnosis of SARS-CoV-2 by FDA under an Emergency Use Authorization (EUA). This EUA will remain  in effect (meaning this test can be used) for the duration of the COVID-19 declaration under Section 56 4(b)(1) of the Act, 21 U.S.C. section 360bbb-3(b)(1), unless the authorization is terminated or revoked sooner. Performed at [facility], [address], [place], [place] [address]   HIV Antibody (routine testing w rflx)     Status: None   Collection Time: 08/04/19 10:44 AM  Result Value Ref Range   HIV Screen 4th Generation wRfx NON REACTIVE NON REACTIVE    Comment: Performed at [facility], [address] [place], [place] [address]  Hemoglobin A1c     Status: Abnormal   Collection Time: 08/04/19 10:44 AM  Result Value Ref Range   Hgb A1c MFr Bld 12.1 (H) 4.8 - 5.6  %    Comment: (NOTE)         Prediabetes: 5.7 - 6.4         Diabetes: >6.4         Glycemic control for adults with diabetes: <7.0    Mean Plasma Glucose 301 mg/dL    Comment: (NOTE) Performed At: [facility] [address] [place], [place] [id number] Jolene Schimke MD FW:[id number]   Lipid panel     Status: Abnormal   Collection Time: 08/04/19 10:44 AM  Result Value Ref Range   Cholesterol 240 (H) 0 - 200 mg/dL   Triglycerides 79 <277 mg/dL   HDL 48 >41 mg/dL   Total CHOL/HDL Ratio 5.0 RATIO   VLDL 16 0 - 40 mg/dL   LDL Cholesterol 287 (H) 0 - 99 mg/dL    Comment:        Total Cholesterol/HDL:CHD Risk Coronary Heart Disease Risk Table                     Men   Women  1/2 Average Risk   3.4   3.3  Average Risk       5.0   4.4  2 X Average Risk   9.6   7.1  3 X Average Risk  23.4   11.0        Use the calculated Patient Ratio above and the CHD Risk Table to determine the patient's CHD Risk.        ATP III CLASSIFICATION (LDL):  <100     mg/dL   Optimal  867-672  mg/dL   Near or Above  Optimal  130-159  mg/dL   Borderline  160-189  mg/dL   High  >190     mg/dL   Very High Performed at [facility] [address], [place], [place] [address]   Glucose, capillary     Status: Abnormal   Collection Time: 08/04/19  4:14 PM  Result Value Ref Range   Glucose-Capillary 237 (H) 70 - 99 mg/dL    Comment: Glucose reference range applies only to samples taken after fasting for at least 8 hours.  Glucose, capillary     Status: Abnormal   Collection Time: 08/04/19  9:21 PM  Result Value Ref Range   Glucose-Capillary 230 (H) 70 - 99 mg/dL    Comment: Glucose reference range applies only to samples taken after fasting for at least 8 hours.   Comment 1 Notify RN    Comment 2 Document in Chart   Basic metabolic panel     Status: Abnormal   Collection Time: 08/05/19  4:45 AM  Result Value Ref Range   Sodium 140 135 - 145 mmol/L   Potassium 3.0  (L) 3.5 - 5.1 mmol/L   Chloride 103 98 - 111 mmol/L   CO2 30 22 - 32 mmol/L   Glucose, Bld 205 (H) 70 - 99 mg/dL    Comment: Glucose reference range applies only to samples taken after fasting for at least 8 hours.   BUN 10 6 - 20 mg/dL   Creatinine, Ser 1.16 0.61 - 1.24 mg/dL   Calcium 8.1 (L) 8.9 - 10.3 mg/dL   GFR calc non Af Amer >60 >60 mL/min   GFR calc Af Amer >60 >60 mL/min   Anion gap 7 5 - 15    Comment: Performed at [facility] [address] [place], [place] [address]  CBC     Status: Abnormal   Collection Time: 08/05/19  4:45 AM  Result Value Ref Range   WBC 6.4 4.0 - 10.5 K/uL   RBC 4.61 4.22 - 5.81 MIL/uL   Hemoglobin 11.7 (L) 13.0 - 17.0 g/dL   HCT 37.0 (L) 39.0 - 52.0 %   MCV 80.3 80.0 - 100.0 fL   MCH 25.4 (L) 26.0 - 34.0 pg   MCHC 31.6 30.0 - 36.0 g/dL   RDW 12.8 11.5 - 15.5 %   Platelets 239 150 - 400 K/uL   nRBC 0.0 0.0 - 0.2 %    Comment: Performed at [facility], [place] [address] [place], [place] [address]  Glucose, capillary     Status: Abnormal   Collection Time: 08/05/19  6:05 AM  Result Value Ref Range   Glucose-Capillary 185 (H) 70 - 99 mg/dL    Comment: Glucose reference range applies only to samples taken after fasting for at least 8 hours.   Comment 1 Notify RN    Comment 2 Document in Chart   Glucose, capillary     Status: Abnormal   Collection Time: 08/05/19 11:13 AM  Result Value Ref Range   Glucose-Capillary 191 (H) 70 - 99 mg/dL    Comment: Glucose reference range applies only to samples taken after fasting for at least 8 hours.     ROS:  A comprehensive review of systems was negative except for: Cardiovascular: positive for exertional chest pressure/discomfort and lower extremity edema  Physical Exam: Vitals:   08/05/19 0721 08/05/19 1116  BP: (!) 157/96 (!) 156/98  Pulse: 92 95  Resp: 17 18  Temp: 98.2 F (36.8  C) 98 F (36.7 C)  SpO2: 100% 98%     General: Obese black male who is comfortable lying in bed.   He reports difficulty ambulating secondary to significant lower extremity and groin edema HEENT: Pupils are equally round and reactive to light, extraocular motions are intact, mucous membranes are moist Neck: Obese neck, cannot appreciate JVD Heart: Regular rate and rhythm Lungs: Decreased breath sounds at the bases Abdomen: Obese and pitting edema to abdominal wall Extremities: 2-3+ pitting edema-into the groin area-scrotum size of softball Skin: Warm and dry Neuro: Alert and nonfocal  Assessment/Plan: 44 year old black male presents with nephrotic syndrome 1.Renal-nephrotic syndrome as evidenced by decreased albumin and clinical edema.  Protein creatinine ratio estimated with 5 g of proteinuria.  Lipid panel abnormal.  With common things being common, poorly controlled diabetes mellitus can give nephrotic syndrome.  The clinical characteristics that make me think possibly something else could be going on is the fact that he has microscopic hematuria and that the edema seem to worsen quickly just over the last few weeks.  However, I do not feel we need to rush into a kidney biopsy.  I will do a little more thorough of a serologic work-up.  We will medically treat his nephrotic syndrome with IV Lasix-we will try to increase the dose of.  I will also start him on lisinopril 20 mg today looking to titrate this up to the maximum dose if he tolerates.  Fortunately, his renal function is preserved.  Will look to clinically get things better and if still concerned about another etiology of this nephrotic syndrome can pursue a kidney biopsy as an OP  2. Hypertension/volume  -massive volume overload and hypertension.  I would like to use an ACE inhibitor to decrease proteinuria so we will start Lasix 20 mg today.  We will increase IV Lasix as he has massive amount of fluid to be removed 3. Elytes  -anticipate he will need a lot more potassium repletion especially with increased dose of Lasix.  Check magnesium  and follow that as well   Cecille AverKellie A Tanzie Rothschild 08/05/2019, 1:33 PM

## 2019-08-05 NOTE — Plan of Care (Signed)
  Problem: Education: Goal: Ability to demonstrate management of disease process will improve Outcome: Progressing Goal: Ability to verbalize understanding of medication therapies will improve Outcome: Progressing   Problem: Clinical Measurements: Goal: Will remain free from infection Outcome: Progressing Goal: Respiratory complications will improve Outcome: Progressing   Problem: Coping: Goal: Level of anxiety will decrease Outcome: Progressing   Problem: Safety: Goal: Ability to remain free from injury will improve Outcome: Progressing

## 2019-08-06 DIAGNOSIS — N049 Nephrotic syndrome with unspecified morphologic changes: Secondary | ICD-10-CM

## 2019-08-06 LAB — BASIC METABOLIC PANEL
Anion gap: 10 (ref 5–15)
BUN: 8 mg/dL (ref 6–20)
CO2: 31 mmol/L (ref 22–32)
Calcium: 8.3 mg/dL — ABNORMAL LOW (ref 8.9–10.3)
Chloride: 99 mmol/L (ref 98–111)
Creatinine, Ser: 1.2 mg/dL (ref 0.61–1.24)
GFR calc Af Amer: >60 mL/min (ref >60)
GFR calc non Af Amer: >60 mL/min (ref >60)
Glucose, Bld: 199 mg/dL — ABNORMAL HIGH (ref 70–99)
Potassium: 3.3 mmol/L — ABNORMAL LOW (ref 3.5–5.1)
Sodium: 140 mmol/L (ref 135–145)

## 2019-08-06 LAB — GLUCOSE, CAPILLARY
Glucose-Capillary: 202 mg/dL — ABNORMAL HIGH (ref 70–99)
Glucose-Capillary: 187 mg/dL — ABNORMAL HIGH (ref 70–99)
Glucose-Capillary: 193 mg/dL — ABNORMAL HIGH (ref 70–99)
Glucose-Capillary: 175 mg/dL — ABNORMAL HIGH (ref 70–99)
Glucose-Capillary: 209 mg/dL — ABNORMAL HIGH (ref 70–99)

## 2019-08-06 LAB — CBC
HCT: 38 % — ABNORMAL LOW (ref 39.0–52.0)
Hemoglobin: 12.2 g/dL — ABNORMAL LOW (ref 13.0–17.0)
MCH: 25.7 pg — ABNORMAL LOW (ref 26.0–34.0)
MCHC: 32.1 g/dL (ref 30.0–36.0)
MCV: 80.2 fL (ref 80.0–100.0)
Platelets: 253 10*3/uL (ref 150–400)
RBC: 4.74 MIL/uL (ref 4.22–5.81)
RDW: 12.8 % (ref 11.5–15.5)
WBC: 7 10*3/uL (ref 4.0–10.5)
nRBC: 0 % (ref 0.0–0.2)

## 2019-08-06 LAB — MAGNESIUM: Magnesium: 1.5 mg/dL — ABNORMAL LOW (ref 1.7–2.4)

## 2019-08-06 LAB — HEPATITIS B SURFACE ANTIGEN: Hepatitis B Surface Ag: NONREACTIVE

## 2019-08-06 LAB — HEPATITIS B CORE ANTIBODY, TOTAL: Hep B Core Total Ab: NONREACTIVE

## 2019-08-06 MED ORDER — MAGNESIUM OXIDE 400 (241.3 MG) MG PO TABS
400.0000 mg | ORAL_TABLET | Freq: Three times a day (TID) | ORAL | Status: DC
  Administered 2019-08-06 – 2019-08-11 (×16): 400 mg via ORAL
  Filled 2019-08-06 (×16): qty 1

## 2019-08-06 MED ORDER — SODIUM CHLORIDE 0.9 % IV SOLN
INTRAVENOUS | Status: DC | PRN
  Administered 2019-08-06: 250 mL via INTRAVENOUS

## 2019-08-06 MED ORDER — MAGNESIUM SULFATE 2 GM/50ML IV SOLN
2.0000 g | Freq: Once | INTRAVENOUS | Status: AC
  Administered 2019-08-06: 2 g via INTRAVENOUS
  Filled 2019-08-06: qty 50

## 2019-08-06 NOTE — Evaluation (Signed)
Physical Therapy Evaluation Patient Details Name: Edward Page MRN: [id number] DOB: 1975/09/26 Today's Date: 08/06/2019   History of Present Illness  Pt is a 44 y/o male admitted secondary to increased LE and scrotal swelling likely from nephrotic syndrome. PMH includes DM and HTN.   Clinical Impression  Pt admitted secondary to problem above with deficits below. Pt very limited this session secondary to increased pain in scrotal area. Was only able to perform bed mobility this session. Feel pt will progress well once pain controlled. Will continue to follow acutely to maximize functional mobility independence and safety.     Follow Up Recommendations Supervision for mobility/OOB(TBD pending progression )    Equipment Recommendations  Other (comment)(TBD)    Recommendations for Other Services       Precautions / Restrictions Precautions Precautions: Fall Restrictions Weight Bearing Restrictions: No      Mobility  Bed Mobility Overal bed mobility: Needs Assistance Bed Mobility: Supine to Sit;Sit to Supine;Rolling Rolling: Supervision   Supine to sit: Supervision Sit to supine: Supervision   General bed mobility comments: Supervision and increased time to come to partial sit. Pt with Increased pain in scrotum, so further mobility deferred. Pt rolled from side to side with use of bed rail for clean up following session.   Transfers                    Ambulation/Gait                Stairs            Wheelchair Mobility    Modified Rankin (Stroke Patients Only)       Balance Overall balance assessment: Needs assistance     Sitting balance - Comments: Only able to come to partial sit secondary to pain in scrotal area.                                      Pertinent Vitals/Pain Pain Assessment: 0-10 Pain Score: 10-Worst pain ever Pain Location: scrotum  Pain Descriptors / Indicators: Grimacing;Guarding;Sharp Pain  Intervention(s): Limited activity within patient's tolerance;Monitored during session;Repositioned    Home Living Family/patient expects to be discharged to:: Private residence Living Arrangements: Spouse/significant other Available Help at Discharge: Family;Available 24 hours/day Type of Home: Apartment Home Access: Stairs to enter Entrance Stairs-Rails: Right;Left;Can reach both Entrance Stairs-Number of Steps: flight Home Layout: One level Home Equipment: None      Prior Function Level of Independence: Independent         Comments: Works at [employer]        Extremity/Trunk Assessment   Upper Extremity Assessment Upper Extremity Assessment: Defer to OT evaluation    Lower Extremity Assessment Lower Extremity Assessment: Generalized weakness    Cervical / Trunk Assessment Cervical / Trunk Assessment: Normal  Communication   Communication: No difficulties  Cognition Arousal/Alertness: Awake/alert Behavior During Therapy: WFL for tasks assessed/performed Overall Cognitive Status: Within Functional Limits for tasks assessed                                        General Comments      Exercises     Assessment/Plan    PT Assessment Patient needs continued PT services  PT Problem List Decreased balance;Decreased mobility;Decreased activity tolerance;Decreased knowledge  of use of DME;Decreased knowledge of precautions;Pain       PT Treatment Interventions DME instruction;Gait training;Stair training;Functional mobility training;Therapeutic activities;Therapeutic exercise;Balance training;Patient/family education    PT Goals (Current goals can be found in the Care Plan section)  Acute Rehab PT Goals Patient Stated Goal: to decrease pain  PT Goal Formulation: With patient Time For Goal Achievement: 08/20/19 Potential to Achieve Goals: Good    Frequency Min 3X/week   Barriers to discharge        Co-evaluation                AM-PAC PT "6 Clicks" Mobility  Outcome Measure Help needed turning from your back to your side while in a flat bed without using bedrails?: None Help needed moving from lying on your back to sitting on the side of a flat bed without using bedrails?: None Help needed moving to and from a bed to a chair (including a wheelchair)?: A Little Help needed standing up from a chair using your arms (e.g., wheelchair or bedside chair)?: A Little Help needed to walk in hospital room?: A Little Help needed climbing 3-5 steps with a railing? : A Lot 6 Click Score: 19    End of Session Equipment Utilized During Treatment: Gait belt Activity Tolerance: Patient limited by pain Patient left: in bed;with call bell/phone within reach Nurse Communication: Mobility status PT Visit Diagnosis: Muscle weakness (generalized) (M62.81);Pain Pain - part of body: (scrotum)    Time: 5701-7793 PT Time Calculation (min) (ACUTE ONLY): 24 min   Charges:   PT Evaluation $PT Eval Moderate Complexity: 1 Mod PT Treatments $Therapeutic Activity: 8-22 mins        Edward Page, DPT  Acute Rehabilitation Services  Pager: [phone] Office: [phone]   Edward Page 08/06/2019, 5:57 PM

## 2019-08-06 NOTE — Progress Notes (Signed)
Patient resting comfortably during shift report. Denies complaints.  

## 2019-08-06 NOTE — Progress Notes (Signed)
Patients mother is at bedside today. Phone call placed to sister-in-law (nurse) on speaker. Ok to discuss, per patient.   All questions asked by patient/mother/sister-in-law were answered at bedside over phone.

## 2019-08-06 NOTE — Progress Notes (Addendum)
Spoke to patient at the bedside to review insulin pen technique. Given insulin pen starter kit and Living Well with Diabetes booklet.   Reviewed insulin pen procedure, dosing, removal of needle from pen, and storage of insulin pens and needle disposal. Patient returned demonstration without a problem. Encouraged patient to practice giving injection when nurse is ready to give insulin. Patient willing to take insulin at home if needed. Patient states that he has health insurance.   Harvel Ricks RN BSN CDE Diabetes Coordinator Pager: [phone]  8am-5pm

## 2019-08-06 NOTE — Progress Notes (Signed)
Patient ID: Edward Page, male   DOB: 12-09-1975, 44 y.o.   MRN: [id number] [facility] Progress Note   Assessment/ Plan:   1.  Nephrotic syndrome: With significant anasarca and decent response to diuretics.  Serological work-up underway to evaluate for etiology; demographically high risk for FSGS.  HIV testing was negative and free light chains not convincing for plasma cell dyscrasia.  Antinuclear antibody, hepatitis testing, ANCA and anti-GBM antibody pending.  He is on lisinopril 20 mg daily along with furosemide 80 mg twice a day intravenously with good response.  I will add complement levels to assess for MPGN/C3 glomerulopathy. 2.  Hypertension: On lisinopril for proteinuria mitigation/hypertension and ongoing efforts at volume unloading with furosemide.  Discussed sodium restriction. 3.  Hypokalemia: Secondary to ongoing diuresis and associated hypomagnesemia.  Replace both potassium and magnesium via oral route.  Anticipate lisinopril will help with some potassium conservation. 4.  Diabetes mellitus: Significantly elevated hemoglobin A1c noted on initial labs, continue efforts at glycemic management.  Subjective:   Reports to be feeling better but still bothered by scrotal edema.   Objective:   BP (!) 144/93 (BP Location: Left Arm)   Pulse 100   Temp 99.2 F (37.3 C) (Oral)   Resp 20   Ht 5' 9"  (1.753 m)   Wt (!) 158.5 kg   SpO2 93%   BMI 51.60 kg/m   Intake/Output Summary (Last 24 hours) at 08/06/2019 0835 Last data filed at 08/06/2019 1552 Gross per 24 hour  Intake 540 ml  Output 5280 ml  Net -4740 ml   Weight change: -3.357 kg  Physical Exam: Gen: Comfortably resting in bed, easy to awaken/engage in conversation CVS: Pulse regular rhythm, tachycardia, S1 and S2 normal Resp: Poor breath sounds over bases-no distinct rales or rhonchi Abd: Soft, obese, nontender, abdominal edema present Ext: 2-3+ pitting edema over both lower extremities, scrotal edema  noted  Imaging: DG Chest 2 View  Result Date: 08/04/2019 CLINICAL DATA:  Dyspnea, leg swelling. EXAM: CHEST - 2 VIEW COMPARISON:  None. FINDINGS: Patchy bibasilar opacities. No pneumothorax or pleural effusion. Enlarged cardiac silhouette.  Prominent perihilar vessels. No acute osseous abnormality. IMPRESSION: Bibasilar opacities may reflect atelectasis versus edema. Cardiomegaly with prominent perihilar vessels. Electronically Signed   By: Primitivo Gauze M.D.   On: 08/04/2019 09:29   US RENAL  Result Date: 08/05/2019 CLINICAL DATA:  Nephrotic syndrome EXAM: RENAL / URINARY TRACT ULTRASOUND COMPLETE COMPARISON:  None. FINDINGS: Right Kidney: Renal measurements: 70.5 x 7.2 x 7.4 cm = volume: 490.8 mL. Normal cortical echogenicity and corticomedullary differentiation. Slight pelviectasis without calyceal blunting or dilatation to suggest frank hydronephrosis. No visible obstructing calculus or worrisome renal mass. Left Kidney: Nonvisualization of the left kidney, in part due to patient body habitus. Bladder: Appears normal for degree of bladder distention. Other: Suboptimal imaging quality due to patient body habitus. IMPRESSION: Nonvisualization of the left kidney. Mild right pelviectasis without frank hydronephrosis. Electronically Signed   By: Lovena Le M.D.   On: 08/05/2019 18:48   ECHOCARDIOGRAM COMPLETE  Result Date: 08/04/2019    ECHOCARDIOGRAM REPORT   Patient Name:   Edward Page Date of Exam: 08/04/2019 Medical Rec #:  [id number]      Height:       69.0 in Accession #:    [id number]     Weight:       350.0 lb Date of Birth:  06-07-1975      BSA:  2.620 m Patient Age:    62 years       BP:           159/91 mmHg Patient Gender: M              HR:           96 bpm. Exam Location:  Inpatient Procedure: 2D Echo Indications:    CHF 428  History:        Patient has no prior history of Echocardiogram examinations.                 CHF; Risk Factors:Hypertension and Diabetes.   Sonographer:    Jannett Celestine RDCS (AE) Referring Phys: [id number] [facility] NARENDRA  Sonographer Comments: Patient is morbidly obese. Image acquisition challenging due to patient body habitus. limited mobility IMPRESSIONS  1. Left ventricular ejection fraction, by estimation, is 50 to 55%. The left ventricle has low normal function. The left ventricle demonstrates global hypokinesis. The left ventricular internal cavity size was mildly dilated. There is mild concentric left ventricular hypertrophy. Left ventricular diastolic parameters are consistent with Grade II diastolic dysfunction (pseudonormalization). Elevated left atrial pressure.  2. Right ventricular systolic function is normal. The right ventricular size is normal. There is moderately elevated pulmonary artery systolic pressure. The estimated right ventricular systolic pressure is 22.2 mmHg.  3. Left atrial size was mildly dilated.  4. The mitral valve is normal in structure. Trivial mitral valve regurgitation.  5. The aortic valve is normal in structure. Aortic valve regurgitation is not visualized.  6. The inferior vena cava is dilated in size with <50% respiratory variability, suggesting right atrial pressure of 15 mmHg. FINDINGS  Left Ventricle: Left ventricular ejection fraction, by estimation, is 50 to 55%. The left ventricle has low normal function. The left ventricle demonstrates global hypokinesis. The left ventricular internal cavity size was mildly dilated. There is mild concentric left ventricular hypertrophy. Left ventricular diastolic parameters are consistent with Grade II diastolic dysfunction (pseudonormalization). Elevated left atrial pressure. Right Ventricle: The right ventricular size is normal. No increase in right ventricular wall thickness. Right ventricular systolic function is normal. There is moderately elevated pulmonary artery systolic pressure. The tricuspid regurgitant velocity is 3.00 m/s, and with an assumed right atrial  pressure of 15 mmHg, the estimated right ventricular systolic pressure is 97.9 mmHg. Left Atrium: Left atrial size was mildly dilated. Right Atrium: Right atrial size was normal in size. Pericardium: Trivial pericardial effusion is present. The pericardial effusion is posterior to the left ventricle. Mitral Valve: The mitral valve is normal in structure. Trivial mitral valve regurgitation. Tricuspid Valve: The tricuspid valve is normal in structure. Tricuspid valve regurgitation is mild. Aortic Valve: The aortic valve is normal in structure. Aortic valve regurgitation is not visualized. Pulmonic Valve: The pulmonic valve was normal in structure. Pulmonic valve regurgitation is not visualized. Aorta: The aortic root and ascending aorta are structurally normal, with no evidence of dilitation. Venous: The inferior vena cava is dilated in size with less than 50% respiratory variability, suggesting right atrial pressure of 15 mmHg. IAS/Shunts: No atrial level shunt detected by color flow Doppler.  LEFT VENTRICLE PLAX 2D LVIDd:         6.10 cm  Diastology LVIDs:         4.60 cm  LV e' lateral:   9.68 cm/s LV PW:         1.36 cm  LV E/e' lateral: 12.0 LV IVS:  1.20 cm  LV e' medial:    7.51 cm/s LVOT diam:     2.30 cm  LV E/e' medial:  15.4 LV SV:         72 LV SV Index:   28 LVOT Area:     4.15 cm  RIGHT VENTRICLE RV S prime:     10.70 cm/s TAPSE (M-mode): 2.3 cm LEFT ATRIUM           Index       RIGHT ATRIUM           Index LA diam:      4.40 cm 1.68 cm/m  RA Area:     15.10 cm LA Vol (A4C): 61.4 ml 23.41 ml/m RA Volume:   34.40 ml  13.13 ml/m  AORTIC VALVE LVOT Vmax:   101.00 cm/s LVOT Vmean:  71.000 cm/s LVOT VTI:    0.174 m  AORTA Ao Root diam: 3.50 cm MITRAL VALVE                TRICUSPID VALVE MV Area (PHT): 3.85 cm     TR Peak grad:   36.0 mmHg MV Decel Time: 197 msec     TR Vmax:        300.00 cm/s MV E velocity: 116.00 cm/s                             SHUNTS                             Systemic VTI:   0.17 m                             Systemic Diam: 2.30 cm Mihai Croitoru MD Electronically signed by Sanda Klein MD Signature Date/Time: 08/04/2019/1:59:21 PM    Final     Labs: BMET Recent Labs  Lab 08/04/19 0246 08/05/19 0445 08/06/19 0611  NA 139 140 140  K 3.5 3.0* 3.3*  CL 103 103 99  CO2 25 30 31   GLUCOSE 259* 205* 199*  BUN 14 10 8   CREATININE 1.30* 1.16 1.20  CALCIUM 8.5* 8.1* 8.3*   CBC Recent Labs  Lab 08/04/19 0246 08/05/19 0445 08/06/19 0611  WBC 8.8 6.4 7.0  HGB 12.6* 11.7* 12.2*  HCT 40.8 37.0* 38.0*  MCV 81.8 80.3 80.2  PLT 288 239 253    Medications:    . atorvastatin  40 mg Oral Daily  . enoxaparin (LOVENOX) injection  80 mg Subcutaneous Q24H  . furosemide  80 mg Intravenous Q12H  . insulin aspart  0-15 Units Subcutaneous TID WC  . insulin starter kit- pen needles  1 kit Other Once  . lisinopril  20 mg Oral Daily  . living well with diabetes book   Does not apply Once  . potassium chloride  40 mEq Oral BID   Elmarie Shiley, MD 08/06/2019, 8:35 AM

## 2019-08-06 NOTE — Hospital Course (Addendum)
Disposition and Follow-Up Edward Page was discharged from [facility] in stable condition. At the hospital follow-up visit, please address:  Nephrotic Syndrome -Ensure symptoms have resolved -Ensure f/u with Nephrology -Labs requiring f/u at time of discharge: C3/C4, HBV, HCV, anti-GBM, ANCA, protein electrophoresis    Hospital Course by Problem List:  Nephrotic Syndrome Edward Page is a 44 year old man with a past medical history of diabetes and hypertension who presented to the [facility] ED with a two day history of progressive scrotal pain and swelling in the context of a two week history of progressive leg swelling and dyspnea on exertion. Upon admission, he was found to have a BNP elevated to 1264, total protein 6.2, and an albumin of 2.4. UA revealed marked proteinuria ~5g/day. The patient was diagnosed with the nephrotic syndrome by virtue of his proteinuria, hypoalbuminemia, and peripheral edema on exam. Also supported by abnormal lipid panel. CXR indicated cardiomegaly and an echo was performed showing EF 50-55%. Considering proteinuria and serum albumin levels, a renal etiology was found to be the most important contributor to his fluid overload rather than CHF exacerbation. Uncontrolled DM was considered as a cause of nephrotic syndrome given HgA1c 12.1% but the acuity of presentation and microscopic hematuria warranted further investigation.  The patient was seen by Nephrology and a workup for nephrotic syndrome was initiated. Demographically, he is high risk for FSGS so HIV/HBV/HCV screening were ordered and negative. Kappa/Lambda light chains and protein electrophoresis were not convincing for plasma cell dyscrasia. Serological workup including ANA, ANCA, and anti-GBM were also ordered and pending at the time of discharge. Complement levels (C3/C4) were ordered to evaluate for MPGN/C3 glomerulopathy and were also pending at the time of discharge.  Edward Page was  treated with Lasix and responded with vigorous diuresis with gradual improvement in BLE edema and scrotal pain/swelling. He was also started on Lisinopril for control of proteinuria as well as hypertension. Discharged with appropriate return precautions such as return of swelling and will instructions for close outpatient follow-up.

## 2019-08-06 NOTE — Progress Notes (Signed)
   Subjective:  Patient examined and evaluated at bedside. Edward Page reports continued scrotal discomfort from swelling but endorses subjective improvement in abdominal wall and BLE swelling. He reports being able to move his legs better than yesterday. He denies any shortness of breath or abdominal pain and has no additional complaints. Otherwise no acute events.  Objective:  Vital signs in last 24 hours: Vitals:   08/05/19 1655 08/05/19 2017 08/06/19 0343 08/06/19 0927  BP: 139/87 (!) 147/93 (!) 144/93 (!) 148/89  Pulse: 97 (!) 101 100 99  Resp:  20 20   Temp:  99.3 F (37.4 C) 99.2 F (37.3 C)   TempSrc:  Oral Oral   SpO2: 100% 91% 93%   Weight:   (!) 158.5 kg   Height:       Weight change: -3.357 kg  Intake/Output Summary (Last 24 hours) at 08/06/2019 1108 Last data filed at 08/06/2019 1308 Gross per 24 hour  Intake 780 ml  Output 4015 ml  Net -3235 ml   General appearance: alert, cooperative, appears stated age and in no apparent distress Head: Normocephalic, without obvious abnormality, atraumatic Lungs: Normal work of breathing. CTAB. Heart: Regular rate and rhythm, S1, S2 normal, no murmur, click, rub or gallop Abdomen: Soft, non-tender; bowel sounds normal; no masses,  no organomegaly. Exam limited by obese habitus Male genitalia: Marked swelling noted in scrotum Extremities: 2-3+ BLE pitting edema with interval improvement from previous day. BUE wnl Neurologic: Grossly normal Psych: Mood and affect normal   Assessment/Plan:  Active Problems:   Acute heart failure [facility])  Summary: [name] is a 44 year old man with a past medical history of hypertension and diabetes who presents to the ED with a two week history of bilateral leg swelling and dyspnea on exertion and a two day history of scrotal swelling and pain.  #Nephrotic Syndrome Edward Page endorses continued swelling and discomfort in his scrotum. However, he does note interval improvement in  abdominal wall and BLE swelling with improved ROM. Physical exam and marked diuresis indicate response to Lasix with interval clinical improvement in fluid overload. So far, nephrotic syndrome workup has been negative; waiting for ANA, hepatitis, ANCA, and anti-GBM labs. Light chains not convincing for plasma cell dyscrasia. Demographic makes patient high risk for FSGS, and should also assess for MPGN/C3 glomerulopathy. Our Nephrology colleagues have been consulted on this case, and we greatly appreciate their evaluation and recommendations. -Nephrology has been consulted, appreciate recs -F/u complement labs -Continue lisinopril 20 mg PO daily to decrease proteinuria, assist BP control, and possibly assist potassium conservation -Increase Lasix to 80 mg IV bid -Monitor BMP and correct electrolytes, as below -Ensure adequate control of BP and BG  #T2DM Home regimen includes Metformin 500 mg bid monotherapy. BG 187 this a.m. on SSI. HgA1c found to be 12.1%. -SSI -Frequent BG checks -Carb modified and heart healthy diet -Continue atorvastatin  #HTN Home regimen includes metoprolol tartrate 25 mg bid. Pressures have remained adequately controlled with diuresis -Hold home regimen   #Hypokalemia #Hypomagnesemia Labs this a.m. show potassium of 3.3 and magnesium 1.5, most likely 2/2 Lasix. Patient denies symptoms of hypokalemia. -Continue oral repletion with KCl PO bid -Give magnesium sulfate 2g IV once -Start oral magnesium repletion with MAG-OX 400 mg tid   Diet: Heart healthy/Carb modified DVT ppx: Lovenox Dispo: Pending clinical improvement    LOS: 2 days   [name], Medical Student 08/06/2019, 11:08 AM

## 2019-08-07 LAB — RENAL FUNCTION PANEL
Albumin: 2 g/dL — ABNORMAL LOW (ref 3.5–5.0)
Anion gap: 10 (ref 5–15)
BUN: 13 mg/dL (ref 6–20)
CO2: 31 mmol/L (ref 22–32)
Calcium: 8.3 mg/dL — ABNORMAL LOW (ref 8.9–10.3)
Chloride: 98 mmol/L (ref 98–111)
Creatinine, Ser: 1.31 mg/dL — ABNORMAL HIGH (ref 0.61–1.24)
GFR calc Af Amer: >60 mL/min (ref >60)
GFR calc non Af Amer: >60 mL/min (ref >60)
Glucose, Bld: 210 mg/dL — ABNORMAL HIGH (ref 70–99)
Phosphorus: 4.3 mg/dL (ref 2.5–4.6)
Potassium: 3.7 mmol/L (ref 3.5–5.1)
Sodium: 139 mmol/L (ref 135–145)

## 2019-08-07 LAB — GLUCOSE, CAPILLARY
Glucose-Capillary: 200 mg/dL — ABNORMAL HIGH (ref 70–99)
Glucose-Capillary: 191 mg/dL — ABNORMAL HIGH (ref 70–99)
Glucose-Capillary: 153 mg/dL — ABNORMAL HIGH (ref 70–99)
Glucose-Capillary: 238 mg/dL — ABNORMAL HIGH (ref 70–99)

## 2019-08-07 LAB — IMMUNOFIXATION ELECTROPHORESIS
IgA: 240 mg/dL (ref 90–386)
IgG (Immunoglobin G), Serum: 985 mg/dL (ref 603–1613)
IgM (Immunoglobulin M), Srm: 40 mg/dL (ref 20–172)
Total Protein ELP: 5.4 g/dL — ABNORMAL LOW (ref 6.0–8.5)

## 2019-08-07 LAB — HEPATITIS B SURFACE ANTIBODY, QUANTITATIVE: Hep B S AB Quant (Post): <3.1 m[IU]/mL — ABNORMAL LOW (ref >9.9)

## 2019-08-07 LAB — GLOMERULAR BASEMENT MEMBRANE ANTIBODIES: GBM Ab: 4 units (ref 0–20)

## 2019-08-07 LAB — MAGNESIUM: Magnesium: 1.7 mg/dL (ref 1.7–2.4)

## 2019-08-07 LAB — ANTINUCLEAR ANTIBODIES, IFA: ANA Ab, IFA: NEGATIVE

## 2019-08-07 MED ORDER — METOPROLOL TARTRATE 25 MG PO TABS
25.0000 mg | ORAL_TABLET | Freq: Two times a day (BID) | ORAL | Status: DC
  Administered 2019-08-07 – 2019-08-11 (×9): 25 mg via ORAL
  Filled 2019-08-07 (×10): qty 1

## 2019-08-07 NOTE — Progress Notes (Signed)
Subjective:  Patient examined and evaluated at bedside. Edward Page reports continuing improvement in leg swelling and is now able to move legs to sit at bedside which is new; however, he still endorses ongoing scrotal discomfort and swelling. He denies shortness of breath and abdominal pain and has no additional complaints.  Cardiac monitoring captured high PVC burden with pattern of ventricular trigeminy.Patient denies symptoms. Otherwise no acute events.  Objective:  Vital signs in last 24 hours: Vitals:   08/06/19 0927 08/06/19 1922 08/07/19 0156 08/07/19 0303  BP: (!) 148/89 (!) 144/89  (!) 161/73  Pulse: 99 100  100  Resp:  20  19  Temp:  98.3 F (36.8 C)  98.3 F (36.8 C)  TempSrc:  Oral  Oral  SpO2:  94%  95%  Weight:   (!) 154.4 kg   Height:       Weight change: -4.037 kg  Intake/Output Summary (Last 24 hours) at 08/07/2019 7829 Last data filed at 08/07/2019 5621 Gross per 24 hour  Intake 1144.96 ml  Output 2775 ml  Net -1630.04 ml   General appearance: alert, cooperative, appears stated age and in no apparent distress Head: Normocephalic, without obvious abnormality, atraumatic Lungs: Normal work of breathing. CTAB. Heart: Regular rate and rhythm, S1, S2 normal, no murmur, click, rub or gallop Abdomen: Soft, non-tender; bowel sounds normal; no masses,  no organomegaly. Exam limited by obese habitus Male genitalia: Moderate swelling noted in scrotum with improvement from previous day Extremities: 2+ BLE pitting edema. BUE wnl Neurologic: Grossly normal Psych: Mood and affect normal   Assessment/Plan:  Principal Problem:   Nephrotic syndrome  Summary: Edward Page is a 44 year old man with a past medical history of hypertension and diabetes who presents to the ED with a two week history of bilateral leg swelling and dyspnea on exertion and a two day history of scrotal swelling and pain.  #Nephrotic Syndrome Mr. Staiger endorses continued swelling and  discomfort in his scrotum. Abdominal wall swelling has resolved and BLE continues to improve. Scrotal swelling markedly decreased from previous day. Patient net negative ~11L fluid since admission on Lasix. Given highly elevated HgA1c, DM is the most likely etiology, but acute presentation and microscopic hematuria warrant further investigation. Demographic makes patient high risk for FSGS, and should also assess for MPGN/C3 glomerulopathy. So far nephrotic syndrome workup with HIV, hepatitis, and SPEP negative. Pending C3/C4, ANA, ANCA, and anti-GBM. Our Nephrology colleagues have been consulted on this case, and we greatly appreciate their evaluation and recommendations. -Nephrology has been consulted, appreciate recs -F/u nephrotic syndrome labs, as described above -Continue lisinopril 20 mg PO daily to decrease proteinuria, assist BP control, and possibly assist potassium conservation -Continue Lasix to 80 mg IV bid -Monitor BMP and correct electrolytes, as below -Ensure adequate control of BP and BG -Consider recommending outpatient management of ppx anticoagulation if proteinuria continues  #T2DM Home regimen includes Metformin 500 mg bid monotherapy. HgA1c found to be 12.1%. CBG 175-209 over the past day. On SSI.  -SSI -Frequent BG checks -Carb modified and heart healthy diet -Continue atorvastatin  #HTN #PVCs Patient home anti-hypertensive regimen includes metoprolol tartrate 25 mg bid. Pressures were previously controlled with diuresis but spiked to 161/73 overnight. Also, cardiac monitoring has recorded a heavy PVC burden with ventricular trigeminy patten. Together, these justify restarting home metoprolol. -Start metoprolol tartrate 25 mg bid -Continue cardiac monitoring  #Hypokalemia #Hypomagnesemia Magnesium has increased from 1.5 to 1.7 with IV and oral repletion 4/15.  Potassium has increased from 3.3 to 3.7 with oral repletion. Given continued large-volume diuresis using Lasix,  continued repletion and electrolyte monitoring is justified. -Continue KCl 40 mEq PO bid -Continue MAG-OX 400 mg tid   Diet: Heart healthy/Carb modified DVT ppx: Lovenox Dispo: Pending clinical improvement.    LOS: 3 days   [name], Medical Student 08/07/2019, 9:39 AM

## 2019-08-07 NOTE — Progress Notes (Signed)
Physical Therapy Treatment Patient Details Name: Edward Page MRN: [id number] DOB: 1975-08-23 Today's Date: 08/07/2019    History of Present Illness Pt is a 44 y/o male admitted secondary to increased LE and scrotal swelling likely from nephrotic syndrome. PMH includes DM and HTN.     PT Comments    Co-treatment with OT for implementation of scrotal sling to decrease pain with mobility. Pt reports he is willing to try anything to improve the pain with his mobility. Pt is supervision for bed mobility, and min guard for transfers and ambulation of 25 feet with Rollator. Pt continues to be limited by pain from swollen scrotum and generalized weakness. D/c plans remain appropriate at this time. PT will continue to follow acutely to progress mobility.   Follow Up Recommendations  Supervision for mobility/OOB(TBD pending progression )     Equipment Recommendations  Other (comment)(TBD)       Precautions / Restrictions Precautions Precautions: Fall Restrictions Weight Bearing Restrictions: No    Mobility  Bed Mobility Overal bed mobility: Needs Assistance Bed Mobility: Supine to Sit;Sit to Supine     Supine to sit: Supervision Sit to supine: Supervision   General bed mobility comments: supervision for safety, increased time and effort to come to side of bed, pt with hips very far forward on bed in order to allow scrotum to hang unimpeded, OT able to adjust scrotal sling to aid in comfort  Transfers Overall transfer level: Needs assistance Equipment used: 4-wheeled walker;Rolling walker (2 wheeled) Transfers: Sit to/from Stand Sit to Stand: Min guard         General transfer comment: min guard for safety for power up to RW, however pt unable to get both legs inside walker, pt sat back down while therapist retrieved bariatric Rollator, pt with min guard for subsequent sit>stand to Rollator. Pt continues to have very wide BoS but with more stability in  Rollator  Ambulation/Gait Ambulation/Gait assistance: Min guard Gait Distance (Feet): 25 Feet Assistive device: 4-wheeled walker Gait Pattern/deviations: Step-through pattern;Decreased step length - right;Decreased step length - left;Wide base of support;Trunk flexed;Antalgic Gait velocity: slowed Gait velocity interpretation: <1.8 ft/sec, indicate of risk for recurrent falls General Gait Details: hands on min guard for safety with slow, antalgic gait, very wide BoS, vc for proximity to RW         Balance Overall balance assessment: Needs assistance Sitting-balance support: Feet supported;No upper extremity supported Sitting balance-Leahy Scale: Fair Sitting balance - Comments: balance decreased by fact that pt so far on the EoB    Standing balance support: Bilateral upper extremity supported;During functional activity Standing balance-Leahy Scale: Poor Standing balance comment: requires UE support and forward flexion for scrotal positioning                            Cognition Arousal/Alertness: Awake/alert Behavior During Therapy: WFL for tasks assessed/performed Overall Cognitive Status: Within Functional Limits for tasks assessed                                           General Comments General comments (skin integrity, edema, etc.): OT fitted pt for scrotal sling during session      Pertinent Vitals/Pain Pain Assessment: Faces Faces Pain Scale: Hurts whole lot Pain Location: scrotum  Pain Descriptors / Indicators: Grimacing;Guarding;Sharp Pain Intervention(s): Limited activity within patient's tolerance;Monitored during session;Repositioned  PT Goals (current goals can now be found in the care plan section) Acute Rehab PT Goals Patient Stated Goal: to decrease pain  PT Goal Formulation: With patient Time For Goal Achievement: 08/20/19 Potential to Achieve Goals: Good Progress towards PT goals: Progressing toward goals     Frequency    Min 3X/week      PT Plan Current plan remains appropriate    Co-evaluation PT/OT/SLP Co-Evaluation/Treatment: Yes Reason for Co-Treatment: Complexity of the patient's impairments (multi-system involvement) PT goals addressed during session: Mobility/safety with mobility        AM-PAC PT "6 Clicks" Mobility   Outcome Measure  Help needed turning from your back to your side while in a flat bed without using bedrails?: None Help needed moving from lying on your back to sitting on the side of a flat bed without using bedrails?: None Help needed moving to and from a bed to a chair (including a wheelchair)?: A Little Help needed standing up from a chair using your arms (e.g., wheelchair or bedside chair)?: A Little Help needed to walk in hospital room?: A Little Help needed climbing 3-5 steps with a railing? : A Lot 6 Click Score: 19    End of Session Equipment Utilized During Treatment: Gait belt Activity Tolerance: Patient limited by pain Patient left: in bed;with call bell/phone within reach Nurse Communication: Mobility status PT Visit Diagnosis: Muscle weakness (generalized) (M62.81);Pain Pain - part of body: (scrotum)     Time: 3343-5686 PT Time Calculation (min) (ACUTE ONLY): 23 min  Charges:  $Gait Training: 8-22 mins                     Said Rueb B. Beverely Risen PT, DPT Acute Rehabilitation Services Pager [phone] Office [phone]    Elon Alas [facility] 08/07/2019, 4:51 PM

## 2019-08-07 NOTE — Progress Notes (Signed)
Admit: 08/04/2019 LOS: 3  26M with nephrotic syndrome, hx/o DM2 and HTN  Subjective:  Marland Kitchen Diuresing well, weights downtrending, feels less edematous . Serologies negative thus far, sveral still pending   04/15 0701 - 04/16 0700 In: 9242 [P.O.:1200; I.V.:11; IV Piggyback:53.9] Out: 6834 [Urine:4175]  Filed Weights   08/05/19 0508 08/06/19 0343 08/07/19 0156  Weight: (!) 165.5 kg (!) 158.5 kg (!) 154.4 kg    Scheduled Meds: . atorvastatin  40 mg Oral Daily  . enoxaparin (LOVENOX) injection  80 mg Subcutaneous Q24H  . furosemide  80 mg Intravenous Q12H  . insulin aspart  0-15 Units Subcutaneous TID WC  . insulin starter kit- pen needles  1 kit Other Once  . lisinopril  20 mg Oral Daily  . living well with diabetes book   Does not apply Once  . magnesium oxide  400 mg Oral TID  . metoprolol tartrate  25 mg Oral BID  . potassium chloride  40 mEq Oral BID   Continuous Infusions: . sodium chloride 250 mL (08/06/19 1131)   PRN Meds:.sodium chloride  Current Labs: reviewed  Results for RANVIR, RENOVATO (MRN [id number]) as of 08/07/2019 12:19  Ref. Range 08/04/2019 03:00  Protein Creatinine Ratio Latest Ref Range: 0.00 - 0.15 mg/mgCre 5.34 (H)  Neg HIV, HBV  Results for SANDOR, ARBOLEDA (MRN [id number]) as of 08/07/2019 12:19  Ref. Range 08/06/2019 06:11  GBM Ab Latest Ref Range: 0 - 20 units 4   Results for JACHOB, MCCLEAN (MRN [id number]) as of 08/07/2019 12:19  Ref. Range 08/06/2019 06:11  GBM Ab Latest Ref Range: 0 - 20 units 4   Results for BADR, PIEDRA (MRN [id number]) as of 08/07/2019 12:19  Ref. Range 08/06/2019 06:11  GBM Ab Latest Ref Range: 0 - 20 units 4   Pending ANCA, ANA, C3, C4  Physical Exam:  Blood pressure (!) 138/101, pulse 86, temperature 98.1 F (36.7 C), temperature source Oral, resp. rate 18, height _0  (1.753 m), weight (!) 154.4 kg, SpO2 91 %. NAD Marked LEE into proximal legs, dependent edema in back and buttocks Obese RRR nl s1s2 CTAB No  rashes/lesions Nonfocal EOMI, glasses NCAIT  A 1. Nephrotic Syndrome, labs as above; etiology unclear; I am not convinced this is purely DN, other serologies possible, FSGS would be most likely (but low SAlb not typical for FSGS from obesity); Serologies pending; Suspect will benefit from biopsy for diagnostic clarity and therapeutic options 2. Hypervolemia, diuresing effectively, cont dosing today 3. DM2, A1c 12% present for ?5y 4. HTN, on ACEi and MTP   P . Cont diuresis . Check HCV as well tomrorow AM labs . Probably will need renal biopspy, might not need to be inpatient for it . Daily weights, Daily Renal Panel, Strict I/Os, Avoid nephrotoxins (NSAIDs, judicious IV Contrast)    Pearson Grippe MD 08/07/2019, 12:18 PM  Recent Labs  Lab 08/05/19 0445 08/06/19 0611 08/07/19 0430  NA 140 140 139  K 3.0* 3.3* 3.7  CL 103 99 98  CO2 _1 GLUCOSE 205* 199* 210*  BUN _2 CREATININE 1.16 1.20 1.31*  CALCIUM 8.1* 8.3* 8.3*  PHOS  --   --  4.3   Recent Labs  Lab 08/04/19 0246 08/05/19 0445 08/06/19 0611  WBC 8.8 6.4 7.0  HGB 12.6* 11.7* 12.2*  HCT 40.8 37.0* 38.0*  MCV 81.8 80.3 80.2  PLT 288 239 253

## 2019-08-07 NOTE — Evaluation (Signed)
Occupational Therapy Evaluation Patient Details Name: Edward Page MRN: [id number] DOB: 07/25/1975 Today's Date: 08/07/2019    History of Present Illness Pt is a 44 y/o male admitted secondary to increased LE and scrotal swelling likely from nephrotic syndrome. PMH includes DM and HTN.    Clinical Impression   PTA pt living independently with spouse and children. At time of eval, pt mostly limited from scrotal pain and swelling. Fabricated and customized scrotal sling for increased comfort and independence in BADLs. Pt educated on wearing schedule, how to wash sling, and how to apply. Pt reports increased comfort with functional mobility. Will need to slowly increase tolerance to raise scrotal sling, for pt still walking with very wide BOS due to posterior positioning of scrotum. Used rollator this session to complete functional mobility a household distance. Will continue to follow pt to check scrotal sling wear and improve BADL independence. No post acute OT follow up recommended. RN staff notified and educated on scrotal sling.    Follow Up Recommendations  No OT follow up    Equipment Recommendations  None recommended by OT    Recommendations for Other Services       Precautions / Restrictions Precautions Precautions: Fall Precaution Comments: scrotal swelling Required Braces or Orthoses: Other Brace Other Brace: scrotal sling Restrictions Weight Bearing Restrictions: No      Mobility Bed Mobility Overal bed mobility: Needs Assistance Bed Mobility: Supine to Sit;Sit to Supine     Supine to sit: Supervision Sit to supine: Supervision   General bed mobility comments: supervision for safety, increased time and effort to come to side of bed, pt with hips very far forward on bed in order to allow scrotum to hang unimpeded, OT able to adjust scrotal sling to aid in comfort  Transfers Overall transfer level: Needs assistance Equipment used: 4-wheeled walker;Rolling walker  (2 wheeled) Transfers: Sit to/from Stand Sit to Stand: Min guard         General transfer comment: min guard for safety for power up to RW, however pt unable to get both legs inside walker, pt sat back down while therapist retrieved bariatric Rollator, pt with min guard for subsequent sit>stand to Rollator. Pt continues to have very wide BoS but with more stability in Rollator    Balance Overall balance assessment: Needs assistance Sitting-balance support: Feet supported;No upper extremity supported Sitting balance-Leahy Scale: Fair Sitting balance - Comments: balance decreased by fact that pt so far on the EoB    Standing balance support: Bilateral upper extremity supported;During functional activity Standing balance-Leahy Scale: Poor Standing balance comment: requires UE support and forward flexion for scrotal positioning                           ADL either performed or assessed with clinical judgement   ADL Overall ADL's : Needs assistance/impaired Eating/Feeding: Independent   Grooming: Independent;Sitting   Upper Body Bathing: Independent;Sitting   Lower Body Bathing: Minimal assistance;Sit to/from stand;Sitting/lateral leans   Upper Body Dressing : Independent;Sitting   Lower Body Dressing: Minimal assistance;Sit to/from stand;Sitting/lateral leans   Toilet Transfer: Min guard;Ambulation;RW   Toileting- Clothing Manipulation and Hygiene: Set up;Sit to/from stand;Sitting/lateral lean   Tub/ Shower Transfer: Min guard;Ambulation;Shower seat   Functional mobility during ADLs: Min guard;Rolling walker General ADL Comments: walks with wide BOS due to scrotal pain and swelling. Customized and fit scrotal sling for increased comfort with BADL     Vision Baseline Vision/History: Wears glasses Wears  Glasses: At all times Patient Visual Report: No change from baseline       Perception     Praxis      Pertinent Vitals/Pain Pain Assessment: Faces Faces  Pain Scale: Hurts whole lot Pain Location: scrotum  Pain Descriptors / Indicators: Grimacing;Guarding;Sharp Pain Intervention(s): Limited activity within patient's tolerance;Monitored during session;Repositioned     Hand Dominance     Extremity/Trunk Assessment Upper Extremity Assessment Upper Extremity Assessment: Overall WFL for tasks assessed   Lower Extremity Assessment Lower Extremity Assessment: Defer to PT evaluation       Communication Communication Communication: No difficulties   Cognition Arousal/Alertness: Awake/alert Behavior During Therapy: WFL for tasks assessed/performed Overall Cognitive Status: Within Functional Limits for tasks assessed                                     General Comments  OT fitted pt for scrotal sling during session    Exercises     Shoulder Instructions      Home Living Family/patient expects to be discharged to:: Private residence Living Arrangements: Spouse/significant other Available Help at Discharge: Family;Available 24 hours/day Type of Home: Apartment Home Access: Stairs to enter Entrance Stairs-Number of Steps: flight Entrance Stairs-Rails: Right;Left;Can reach both Home Layout: One level     Bathroom Shower/Tub: Chief Strategy Officer: Standard     Home Equipment: None          Prior Functioning/Environment Level of Independence: Independent        Comments: Works at [employer] List: Decreased knowledge of use of DME or AE;Decreased knowledge of precautions;Obesity;Increased edema;Decreased activity tolerance;Impaired balance (sitting and/or standing)      OT Treatment/Interventions: Self-care/ADL training;Therapeutic exercise;Patient/family education;Splinting;Energy conservation;Therapeutic activities;DME and/or AE instruction;Balance training    OT Goals(Current goals can be found in the care plan section) Acute Rehab OT Goals Patient Stated Goal: to  decrease pain  OT Goal Formulation: With patient Time For Goal Achievement: 08/21/19 Potential to Achieve Goals: Good  OT Frequency: Min 2X/week   Barriers to D/C:            Co-evaluation   Reason for Co-Treatment: Complexity of the patient's impairments (multi-system involvement) PT goals addressed during session: Mobility/safety with mobility        AM-PAC OT "6 Clicks" Edward Activity     Outcome Measure Help from another person eating meals?: None Help from another person taking care of personal grooming?: None Help from another person toileting, which includes using toliet, bedpan, or urinal?: A Little Help from another person bathing (including washing, rinsing, drying)?: A Little Help from another person to put on and taking off regular upper body clothing?: None Help from another person to put on and taking off regular lower body clothing?: A Little 6 Click Score: 21   End of Session Nurse Communication: Mobility status  Activity Tolerance: Patient tolerated treatment well Patient left: in bed;with call bell/phone within reach  OT Visit Diagnosis: Unsteadiness on feet (R26.81);Pain Pain - part of body: (scrotum)                Time: 2637-8588 OT Time Calculation (min): 22 min Charges:  OT General Charges $OT Visit: 1 Visit OT Evaluation $OT Eval Moderate Complexity: 1 Mod  Dalphine Handing, MSOT, OTR/L Acute Rehabilitation Services [facility] Office Number: [phone] Pager: [phone]  Dalphine Handing 08/07/2019, 5:02 PM

## 2019-08-08 DIAGNOSIS — N049 Nephrotic syndrome with unspecified morphologic changes: Secondary | ICD-10-CM

## 2019-08-08 DIAGNOSIS — R601 Generalized edema: Secondary | ICD-10-CM

## 2019-08-08 DIAGNOSIS — I10 Essential (primary) hypertension: Secondary | ICD-10-CM

## 2019-08-08 DIAGNOSIS — E119 Type 2 diabetes mellitus without complications: Secondary | ICD-10-CM

## 2019-08-08 DIAGNOSIS — E669 Obesity, unspecified: Secondary | ICD-10-CM

## 2019-08-08 DIAGNOSIS — Z79899 Other long term (current) drug therapy: Secondary | ICD-10-CM

## 2019-08-08 DIAGNOSIS — Z7984 Long term (current) use of oral hypoglycemic drugs: Secondary | ICD-10-CM

## 2019-08-08 LAB — RENAL FUNCTION PANEL
Albumin: 2 g/dL — ABNORMAL LOW (ref 3.5–5.0)
Anion gap: 7 (ref 5–15)
BUN: 16 mg/dL (ref 6–20)
CO2: 32 mmol/L (ref 22–32)
Calcium: 8.2 mg/dL — ABNORMAL LOW (ref 8.9–10.3)
Chloride: 98 mmol/L (ref 98–111)
Creatinine, Ser: 1.4 mg/dL — ABNORMAL HIGH (ref 0.61–1.24)
GFR calc Af Amer: >60 mL/min (ref >60)
GFR calc non Af Amer: >60 mL/min (ref >60)
Glucose, Bld: 249 mg/dL — ABNORMAL HIGH (ref 70–99)
Phosphorus: 4.3 mg/dL (ref 2.5–4.6)
Potassium: 4.2 mmol/L (ref 3.5–5.1)
Sodium: 137 mmol/L (ref 135–145)

## 2019-08-08 LAB — GLUCOSE, CAPILLARY
Glucose-Capillary: 219 mg/dL — ABNORMAL HIGH (ref 70–99)
Glucose-Capillary: 210 mg/dL — ABNORMAL HIGH (ref 70–99)
Glucose-Capillary: 190 mg/dL — ABNORMAL HIGH (ref 70–99)
Glucose-Capillary: 184 mg/dL — ABNORMAL HIGH (ref 70–99)

## 2019-08-08 LAB — MAGNESIUM: Magnesium: 1.8 mg/dL (ref 1.7–2.4)

## 2019-08-08 LAB — HEPATITIS C VRS RNA DETECT BY PCR-QUAL: Hepatitis C Vrs RNA by PCR-Qual: NEGATIVE

## 2019-08-08 LAB — C3 COMPLEMENT: C3 Complement: 224 mg/dL — ABNORMAL HIGH (ref 82–167)

## 2019-08-08 LAB — C4 COMPLEMENT: Complement C4, Body Fluid: 52 mg/dL — ABNORMAL HIGH (ref 12–38)

## 2019-08-08 MED ORDER — INSULIN GLARGINE 100 UNIT/ML ~~LOC~~ SOLN
5.0000 [IU] | Freq: Every day | SUBCUTANEOUS | Status: DC
  Filled 2019-08-08: qty 0.05

## 2019-08-08 NOTE — Consult Note (Signed)
Chief Complaint: Patient was seen in consultation today for nephrotic syndrome/random renal biopsy.  Referring Physician(s): Rexene Agent  Supervising Physician: Corrie Mckusick  Patient Status: [facility] - In-pt  History of Present Illness: Edward Page is a 44 y.o. male with a past medical history of hypertension, HF, and diabetes mellitus. He has been admitted to [facility] since 08/04/2019 for management of fluid overload. Nephrology was consulted on admission and patient was found to have nephrotic syndrome without known etiology.  IR consulted by Dr. Joelyn Oms for possible image-guided random renal biopsy to evaluate cause of nephrotic syndrome. Patient awake and alert laying in bed watching TV with no complaints at this time. Denies fever, chills, chest pain, dyspnea, abdominal pain, or headache.   Past Medical History:  Diagnosis Date  . CHF (congestive heart failure) ([place])   . Diabetes mellitus without complication ([place])   . Hypertension     History reviewed. No pertinent surgical history.  Allergies: Patient has no known allergies.  Medications: Prior to Admission medications   Medication Sig Start Date End Date Taking? Authorizing Provider  furosemide (LASIX) 40 MG tablet Take 40 mg by mouth daily. 08/03/19  Yes [provider]  metFORMIN (GLUCOPHAGE) 500 MG tablet Take 500 mg by mouth 2 (two) times daily. 08/03/19  Yes [provider]  metoprolol tartrate (LOPRESSOR) 25 MG tablet Take 25 mg by mouth 2 (two) times daily. 08/03/19  Yes [provider]     No family history on file.  Social History   Socioeconomic History  . Marital status: Married    Spouse name: Not on file  . Number of children: Not on file  . Years of education: Not on file  . Highest education level: Not on file  Occupational History  . Not on file  Tobacco Use  . Smoking status: Never Smoker  . Smokeless tobacco: Never Used  Substance and Sexual Activity  . Alcohol  use: Not Currently  . Drug use: Not Currently  . Sexual activity: Not on file  Other Topics Concern  . Not on file  Social History Narrative  . Not on file   Social Determinants of Health   Financial Resource Strain:   . Difficulty of Paying Living Expenses:   Food Insecurity:   . Worried About Charity fundraiser in the Last Year:   . Arboriculturist in the Last Year:   Transportation Needs:   . Film/video editor (Medical):   Marland Kitchen Lack of Transportation (Non-Medical):   Physical Activity:   . Days of Exercise per Week:   . Minutes of Exercise per Session:   Stress:   . Feeling of Stress :   Social Connections:   . Frequency of Communication with Friends and Family:   . Frequency of Social Gatherings with Friends and Family:   . Attends Religious Services:   . Active Member of Clubs or Organizations:   . Attends Archivist Meetings:   Marland Kitchen Marital Status:      Review of Systems: A 12 point ROS discussed and pertinent positives are indicated in the HPI above.  All other systems are negative.  Review of Systems  Constitutional: Negative for chills and fever.  Respiratory: Negative for shortness of breath and wheezing.   Cardiovascular: Negative for chest pain and palpitations.  Gastrointestinal: Negative for abdominal pain.  Neurological: Negative for headaches.  Psychiatric/Behavioral: Negative for behavioral problems and confusion.    Vital Signs: BP (!) 147/97  Pulse 92   Temp 98.7 F (37.1 C) (Oral)   Resp 18   Ht 5\' 9"  (1.753 m)   Wt (!) 335 lb 8 oz (152.2 kg)   SpO2 94%   BMI 49.54 kg/m   Physical Exam Vitals and nursing note reviewed.  Constitutional:      General: He is not in acute distress.    Appearance: Normal appearance.  Cardiovascular:     Rate and Rhythm: Normal rate and regular rhythm.     Heart sounds: Normal heart sounds. No murmur.  Pulmonary:     Effort: Pulmonary effort is normal. No respiratory distress.     Breath  sounds: Normal breath sounds. No wheezing.  Skin:    General: Skin is warm and dry.  Neurological:     Mental Status: He is alert and oriented to person, place, and time.      MD Evaluation Airway: WNL Heart: WNL Abdomen: WNL Chest/ Lungs: WNL ASA  Classification: 2 Mallampati/Airway Score: Two   Imaging: DG Chest 2 View  Result Date: 08/04/2019 CLINICAL DATA:  Dyspnea, leg swelling. EXAM: CHEST - 2 VIEW COMPARISON:  None. FINDINGS: Patchy bibasilar opacities. No pneumothorax or pleural effusion. Enlarged cardiac silhouette.  Prominent perihilar vessels. No acute osseous abnormality. IMPRESSION: Bibasilar opacities may reflect atelectasis versus edema. Cardiomegaly with prominent perihilar vessels. Electronically Signed   By: 08/06/2019 M.D.   On: 08/04/2019 09:29   08/06/2019 RENAL  Result Date: 08/05/2019 CLINICAL DATA:  Nephrotic syndrome EXAM: RENAL / URINARY TRACT ULTRASOUND COMPLETE COMPARISON:  None. FINDINGS: Right Kidney: Renal measurements: 70.5 x 7.2 x 7.4 cm = volume: 490.8 mL. Normal cortical echogenicity and corticomedullary differentiation. Slight pelviectasis without calyceal blunting or dilatation to suggest frank hydronephrosis. No visible obstructing calculus or worrisome renal mass. Left Kidney: Nonvisualization of the left kidney, in part due to patient body habitus. Bladder: Appears normal for degree of bladder distention. Other: Suboptimal imaging quality due to patient body habitus. IMPRESSION: Nonvisualization of the left kidney. Mild right pelviectasis without frank hydronephrosis. Electronically Signed   By: 08/07/2019 M.D.   On: 08/05/2019 18:48   ECHOCARDIOGRAM COMPLETE  Result Date: 08/04/2019    ECHOCARDIOGRAM REPORT   Patient Name:   Edward Page Date of Exam: 08/04/2019 Medical Rec #:  08/06/2019      Height:       69.0 in Accession #:    [id number]     Weight:       350.0 lb Date of Birth:  [birth year]      BSA:          2.620 m Patient Age:    43  years       BP:           159/91 mmHg Patient Gender: M              HR:           96 bpm. Exam Location:  Inpatient Procedure: 2D Echo Indications:    CHF 428  History:        Patient has no prior history of Echocardiogram examinations.                 CHF; Risk Factors:Hypertension and Diabetes.  Sonographer:    09/06/1975 RDCS (AE) Referring Phys: Celene Skeen [facility] NARENDRA  Sonographer Comments: Patient is morbidly obese. Image acquisition challenging due to patient body habitus. limited mobility IMPRESSIONS  1. Left ventricular ejection fraction, by estimation, is 50 to 55%.  The left ventricle has low normal function. The left ventricle demonstrates global hypokinesis. The left ventricular internal cavity size was mildly dilated. There is mild concentric left ventricular hypertrophy. Left ventricular diastolic parameters are consistent with Grade II diastolic dysfunction (pseudonormalization). Elevated left atrial pressure.  2. Right ventricular systolic function is normal. The right ventricular size is normal. There is moderately elevated pulmonary artery systolic pressure. The estimated right ventricular systolic pressure is 51.0 mmHg.  3. Left atrial size was mildly dilated.  4. The mitral valve is normal in structure. Trivial mitral valve regurgitation.  5. The aortic valve is normal in structure. Aortic valve regurgitation is not visualized.  6. The inferior vena cava is dilated in size with <50% respiratory variability, suggesting right atrial pressure of 15 mmHg. FINDINGS  Left Ventricle: Left ventricular ejection fraction, by estimation, is 50 to 55%. The left ventricle has low normal function. The left ventricle demonstrates global hypokinesis. The left ventricular internal cavity size was mildly dilated. There is mild concentric left ventricular hypertrophy. Left ventricular diastolic parameters are consistent with Grade II diastolic dysfunction (pseudonormalization). Elevated left atrial pressure.  Right Ventricle: The right ventricular size is normal. No increase in right ventricular wall thickness. Right ventricular systolic function is normal. There is moderately elevated pulmonary artery systolic pressure. The tricuspid regurgitant velocity is 3.00 m/s, and with an assumed right atrial pressure of 15 mmHg, the estimated right ventricular systolic pressure is 51.0 mmHg. Left Atrium: Left atrial size was mildly dilated. Right Atrium: Right atrial size was normal in size. Pericardium: Trivial pericardial effusion is present. The pericardial effusion is posterior to the left ventricle. Mitral Valve: The mitral valve is normal in structure. Trivial mitral valve regurgitation. Tricuspid Valve: The tricuspid valve is normal in structure. Tricuspid valve regurgitation is mild. Aortic Valve: The aortic valve is normal in structure. Aortic valve regurgitation is not visualized. Pulmonic Valve: The pulmonic valve was normal in structure. Pulmonic valve regurgitation is not visualized. Aorta: The aortic root and ascending aorta are structurally normal, with no evidence of dilitation. Venous: The inferior vena cava is dilated in size with less than 50% respiratory variability, suggesting right atrial pressure of 15 mmHg. IAS/Shunts: No atrial level shunt detected by color flow Doppler.  LEFT VENTRICLE PLAX 2D LVIDd:         6.10 cm  Diastology LVIDs:         4.60 cm  LV e' lateral:   9.68 cm/s LV PW:         1.36 cm  LV E/e' lateral: 12.0 LV IVS:        1.20 cm  LV e' medial:    7.51 cm/s LVOT diam:     2.30 cm  LV E/e' medial:  15.4 LV SV:         72 LV SV Index:   28 LVOT Area:     4.15 cm  RIGHT VENTRICLE RV S prime:     10.70 cm/s TAPSE (M-mode): 2.3 cm LEFT ATRIUM           Index       RIGHT ATRIUM           Index LA diam:      4.40 cm 1.68 cm/m  RA Area:     15.10 cm LA Vol (A4C): 61.4 ml 23.41 ml/m RA Volume:   34.40 ml  13.13 ml/m  AORTIC VALVE LVOT Vmax:   101.00 cm/s LVOT Vmean:  71.000 cm/s LVOT VTI:  0.174 m  AORTA Ao Root diam: 3.50 cm MITRAL VALVE                TRICUSPID VALVE MV Area (PHT): 3.85 cm     TR Peak grad:   36.0 mmHg MV Decel Time: 197 msec     TR Vmax:        300.00 cm/s MV E velocity: 116.00 cm/s                             SHUNTS                             Systemic VTI:  0.17 m                             Systemic Diam: 2.30 cm Rachelle Hora Croitoru MD Electronically signed by Thurmon Fair MD Signature Date/Time: 08/04/2019/1:59:21 PM    Final     Labs:  CBC: Recent Labs    08/04/19 0246 08/05/19 0445 08/06/19 0611  WBC 8.8 6.4 7.0  HGB 12.6* 11.7* 12.2*  HCT 40.8 37.0* 38.0*  PLT 288 239 253    COAGS: No results for input(s): INR, APTT in the last 8760 hours.  BMP: Recent Labs    08/05/19 0445 08/06/19 0611 08/07/19 0430 08/08/19 0311  NA 140 140 139 137  K 3.0* 3.3* 3.7 4.2  CL 103 99 98 98  CO2 30 31 31  32  GLUCOSE 205* 199* 210* 249*  BUN 10 8 13 16   CALCIUM 8.1* 8.3* 8.3* 8.2*  CREATININE 1.16 1.20 1.31* 1.40*  GFRNONAA >60 >60 >60 >60  GFRAA >60 >60 >60 >60    LIVER FUNCTION TESTS: Recent Labs    08/04/19 0808 08/07/19 0430 08/08/19 0311  BILITOT 0.8  --   --   AST 30  --   --   ALT 45*  --   --   ALKPHOS 69  --   --   PROT 6.2*  --   --   ALBUMIN 2.4* 2.0* 2.0*     Assessment and Plan:  Nephrotic syndrome, etiology unknown. Plan for image-guided random renal biopsy tentatively for Monday in IR pending scheduling. Patient will be NPO at midnight prior to procedure. Afebrile and WBCs WNL. Lovenox held per IR protocol. INR pending.  Discussed procedure with patient's father via telephone.  Risks and benefits discussed with the patient including, but not limited to bleeding, infection, damage to adjacent structures or low yield requiring additional tests. All of the patient's questions were answered, patient is agreeable to proceed. Consent signed and in chart.   Thank you for this interesting consult.  I greatly enjoyed  meeting Rylan Bernard and look forward to participating in their care.  A copy of this report was sent to the requesting provider on this date.  Electronically Signed: Saturday, PA-C 08/08/2019, 11:50 AM   I spent a total of 40 Minutes in face to face in clinical consultation, greater than 50% of which was counseling/coordinating care for nephrotic syndrome/random renal biopsy.

## 2019-08-08 NOTE — Progress Notes (Addendum)
   Subjective:  Edward Page is a 44 y.o. with PMH of Dm, HTN, Obesity admit for anasarca on hospital day 4  Edward Page was examined and evaluated at bedside this am. He was observed resting comfortably in bed. He mentions that he received scrotal support and feels much more comfortable today. Denies any chest pain, light-headedness, dizziness, dyspnea.  Objective:  Vital signs in last 24 hours: Vitals:   08/07/19 1559 08/07/19 2105 08/08/19 0355 08/08/19 0530  BP: (!) 135/94 (!) 141/79  (!) 145/100  Pulse: 92 100  88  Resp: (!) 21 20  18   Temp: 98.1 F (36.7 C) 98.2 F (36.8 C)  98.7 F (37.1 C)  TempSrc: Oral Oral  Oral  SpO2: 95% 93%  94%  Weight:   (!) 152.2 kg   Height:       Physical Exam  Constitutional: He is well-developed, well-nourished, and in no distress. No distress.  Cardiovascular: Normal rate, regular rhythm, normal heart sounds and intact distal pulses.  No murmur heard. Pulmonary/Chest: Effort normal and breath sounds normal. He has no wheezes. He has no rales.  Abdominal: Soft. Bowel sounds are normal. He exhibits distension (Abdominal wall edema). There is no abdominal tenderness.  Genitourinary:    Genitourinary Comments: Continued significant scrotal edema. Scrotal support in place   Musculoskeletal:        General: Edema (2+ pitting edema up to abdomen) present. Normal range of motion.  Skin: Skin is warm and dry.   Assessment/Plan:  Principal Problem:   Nephrotic syndrome Active Problems:   Acute congestive heart failure [facility])   Edward Page is a 44 year old man with PMH of HTN, T2DM admit for anasarca 2/2 nephrotic syndrome  Anasarca Nephrotic Syndrome Continues to be volume up. 4L output yesterday for total net negative 14 L since admission. MPGN/C3 negative. All other nephrotic syndrome work-up labs negative. Currently on Furosemide 80mg  BID. Small increase in creatinine from 1.3 to 1.4 this am. Vitals stable. Based on Nephrology  society guidelines,  - Appreciate nephrology recs: C/w lisinopril 20mg  daily, F/u labs, Renal biopsy tomorrow or Monday - C/w furosemide 80mg  IV - I&Os, Daily weights - Monitor electrolytes and replete as needed - If reversible cause of proteinuria not found, can be considered for prophylactic anticoagulation per 2012 KDIGO guidelines (serum albumin <2.5, BMI >35) - level 2c rec. Would likely need Warfarin due to cost of medication with lack of insurance  T2DM Hgb a1c 12.1 On metformin at home. Am cbg 219 - Start Lantus 5 unit qhs once NPO status over - SSI - CBGs  HTN On metoprolol tartrate 25mg  BID. Am bp 141/100. Likely exacerbated by anarsarca. Will reassess once diuresed. - C/w metoprolol tartrate 25mg  BID  DVT prophx: Lovenox (being held for biopsy tomorrow) Diet: HH/CM Code: Full  Dispo: Anticipated discharge in approximately 3-4 day(s).   , MD 08/08/2019, 6:33 AM Pager: [phone]

## 2019-08-08 NOTE — Progress Notes (Signed)
Admit: 08/04/2019 LOS: 4  43M with nephrotic syndrome, hx/o DM2 and HTN  Subjective:  . No c/o, edema slowing improving . He is ok with bx prior to DC . Serologies negative thus far, sveral still pending   04/16 0701 - 04/17 0700 In: 1780 [P.O.:1780] Out: 4100 [Urine:4100]  Filed Weights   08/06/19 0343 08/07/19 0156 08/08/19 0355  Weight: (!) 158.5 kg (!) 154.4 kg (!) 152.2 kg    Scheduled Meds: . atorvastatin  40 mg Oral Daily  . enoxaparin (LOVENOX) injection  80 mg Subcutaneous Q24H  . furosemide  80 mg Intravenous Q12H  . insulin aspart  0-15 Units Subcutaneous TID WC  . insulin glargine  5 Units Subcutaneous QHS  . insulin starter kit- pen needles  1 kit Other Once  . lisinopril  20 mg Oral Daily  . living well with diabetes book   Does not apply Once  . magnesium oxide  400 mg Oral TID  . metoprolol tartrate  25 mg Oral BID  . potassium chloride  40 mEq Oral BID   Continuous Infusions: . sodium chloride 250 mL (08/06/19 1131)   PRN Meds:.sodium chloride  Current Labs: reviewed  C3 and C4 WNL ANA neg SFLC neg, SPEP neg  Results for MAVIN, DYKE (MRN [id number]) as of 08/07/2019 12:19  Ref. Range 08/04/2019 03:00  Protein Creatinine Ratio Latest Ref Range: 0.00 - 0.15 mg/mgCre 5.34 (H)  Neg HIV, HBV  Results for DOW, BLAHNIK (MRN [id number]) as of 08/07/2019 12:19  Ref. Range 08/06/2019 06:11  GBM Ab Latest Ref Range: 0 - 20 units 4    Pending ANCA, HCV  Physical Exam:  Blood pressure (!) 145/100, pulse 88, temperature 98.7 F (37.1 C), temperature source Oral, resp. rate 18, height 5' 9"  (1.753 m), weight (!) 152.2 kg, SpO2 94 %. NAD Marked LEE into proximal legs, dependent edema in back and buttocks Obese RRR nl s1s2 CTAB No rashes/lesions Nonfocal EOMI, glasses NCAIT  A 1. 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 Hypervolemia, diuresing effectively, cont dosing today 3. DM2, A1c 12% present for ?5y 4. HTN, on ACEi and MTP   P . Cont diuresis for another 24h . Plan for biopsy 4/19, will go ahead and consult IR . F/u ANCA and HCV  . Daily weights, Daily Renal Panel, Strict I/Os, Avoid nephrotoxins (NSAIDs, judicious IV Contrast)    Pearson Grippe MD 08/08/2019, 10:26 AM  Recent Labs  Lab 08/06/19 0611 08/07/19 0430 08/08/19 0311  NA 140 139 137  K 3.3* 3.7 4.2  CL 99 98 98  CO2 31 31 32  GLUCOSE 199* 210* 249*  BUN 8 13 16   CREATININE 1.20 1.31* 1.40*  CALCIUM 8.3* 8.3* 8.2*  PHOS  --  4.3 4.3   Recent Labs  Lab 08/04/19 0246 08/05/19 0445 08/06/19 0611  WBC 8.8 6.4 7.0  HGB 12.6* 11.7* 12.2*  HCT 40.8 37.0* 38.0*  MCV 81.8 80.3 80.2  PLT 288 239 253

## 2019-08-09 ENCOUNTER — Inpatient Hospital Stay (HOSPITAL_COMMUNITY): Payer: BLUE CROSS/BLUE SHIELD

## 2019-08-09 DIAGNOSIS — I493 Ventricular premature depolarization: Secondary | ICD-10-CM

## 2019-08-09 LAB — GLUCOSE, CAPILLARY
Glucose-Capillary: 192 mg/dL — ABNORMAL HIGH (ref 70–99)
Glucose-Capillary: 210 mg/dL — ABNORMAL HIGH (ref 70–99)
Glucose-Capillary: 199 mg/dL — ABNORMAL HIGH (ref 70–99)
Glucose-Capillary: 208 mg/dL — ABNORMAL HIGH (ref 70–99)

## 2019-08-09 LAB — RENAL FUNCTION PANEL
Albumin: 2.1 g/dL — ABNORMAL LOW (ref 3.5–5.0)
Anion gap: 8 (ref 5–15)
BUN: 13 mg/dL (ref 6–20)
CO2: 31 mmol/L (ref 22–32)
Calcium: 8.6 mg/dL — ABNORMAL LOW (ref 8.9–10.3)
Chloride: 99 mmol/L (ref 98–111)
Creatinine, Ser: 1.34 mg/dL — ABNORMAL HIGH (ref 0.61–1.24)
GFR calc Af Amer: >60 mL/min (ref >60)
GFR calc non Af Amer: >60 mL/min (ref >60)
Glucose, Bld: 192 mg/dL — ABNORMAL HIGH (ref 70–99)
Phosphorus: 4.4 mg/dL (ref 2.5–4.6)
Potassium: 4.4 mmol/L (ref 3.5–5.1)
Sodium: 138 mmol/L (ref 135–145)

## 2019-08-09 LAB — CBC
HCT: 39.8 % (ref 39.0–52.0)
Hemoglobin: 12.4 g/dL — ABNORMAL LOW (ref 13.0–17.0)
MCH: 25.2 pg — ABNORMAL LOW (ref 26.0–34.0)
MCHC: 31.2 g/dL (ref 30.0–36.0)
MCV: 80.7 fL (ref 80.0–100.0)
Platelets: 258 10*3/uL (ref 150–400)
RBC: 4.93 MIL/uL (ref 4.22–5.81)
RDW: 12.7 % (ref 11.5–15.5)
WBC: 7.4 10*3/uL (ref 4.0–10.5)
nRBC: 0 % (ref 0.0–0.2)

## 2019-08-09 LAB — MAGNESIUM: Magnesium: 1.9 mg/dL (ref 1.7–2.4)

## 2019-08-09 LAB — MPO/PR-3 (ANCA) ANTIBODIES
ANCA Proteinase 3: <3.5 U/mL (ref 0.0–3.5)
Myeloperoxidase Abs: <9 U/mL (ref 0.0–9.0)

## 2019-08-09 NOTE — Plan of Care (Signed)

## 2019-08-09 NOTE — Progress Notes (Signed)
Admit: 08/04/2019 LOS: 5  61M with nephrotic syndrome, hx/o DM2 and HTN  Subjective:  Edward Page Kitchen Further effective diuresis . 5.3L UOP, down 5kg from yesterday . SCr stable, BP stable . For Renal Bx 4/19  04/17 0701 - 04/18 0700 In: 960 [P.O.:960] Out: 5250 [Urine:5250]  Filed Weights   08/07/19 0156 08/08/19 0355 08/09/19 0300  Weight: (!) 154.4 kg (!) 152.2 kg (!) 147.6 kg    Scheduled Meds: . atorvastatin  40 mg Oral Daily  . furosemide  80 mg Intravenous Q12H  . insulin aspart  0-15 Units Subcutaneous TID WC  . insulin starter kit- pen needles  1 kit Other Once  . lisinopril  20 mg Oral Daily  . living well with diabetes book   Does not apply Once  . magnesium oxide  400 mg Oral TID  . metoprolol tartrate  25 mg Oral BID  . potassium chloride  40 mEq Oral BID   Continuous Infusions: . sodium chloride 250 mL (08/06/19 1131)   PRN Meds:.sodium chloride  Current Labs: reviewed  C3 and C4 WNL ANA neg SFLC neg, SPEP neg HCV, HIV, HBV neg  Results for KAZ, AULD (MRN [id number]) as of 08/07/2019 12:19  Ref. Range 08/04/2019 03:00  Protein Creatinine Ratio Latest Ref Range: 0.00 - 0.15 mg/mgCre 5.34 (H)  Neg HIV, HBV  Results for GIULIAN, GOLDRING (MRN [id number]) as of 08/07/2019 12:19  Ref. Range 08/06/2019 06:11  GBM Ab Latest Ref Range: 0 - 20 units 4    Pending ANCA  Physical Exam:  Blood pressure (!) 155/97, pulse 91, temperature 97.8 F (36.6 C), resp. rate 16, height 5' 9"  (1.753 m), weight (!) 147.6 kg, SpO2 90 %. NAD Marked LEE into proximal legs, dependent edema in back and buttocks Obese RRR nl s1s2 CTAB No rashes/lesions Nonfocal EOMI, glasses NCAIT  A 1. 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 Hypervolemia,  diuresing effectively, cont dosing today; probably move to PO diuretic regimen in next 24 to 48h 3. DM2, A1c 12% present for ?5y 4. HTN, on ACEi and MTP   P . Cont diuresis for another 24h . Renal Bx path form in paper chart . biopsy 4/19 . F/u ANCA . No anticogulation at this time pending renal biopsy . Daily weights, Daily Renal Panel, Strict I/Os, Avoid nephrotoxins (NSAIDs, judicious IV Contrast)    Pearson Grippe MD 08/09/2019, 11:00 AM  Recent Labs  Lab 08/07/19 0430 08/08/19 0311 08/09/19 0359  NA 139 137 138  K 3.7 4.2 4.4  CL 98 98 99  CO2 31 32 31  GLUCOSE 210* 249* 192*  BUN 13 16 13   CREATININE 1.31* 1.40* 1.34*  CALCIUM 8.3* 8.2* 8.6*  PHOS 4.3 4.3 4.4   Recent Labs  Lab 08/05/19 0445 08/06/19 0611 08/09/19 0359  WBC 6.4 7.0 7.4  HGB 11.7* 12.2* 12.4*  HCT 37.0* 38.0* 39.8  MCV 80.3 80.2 80.7  PLT 239 253 258

## 2019-08-09 NOTE — Progress Notes (Addendum)
Subjective: Edward Page is a 44 year old man with PMH DM (~5 years), HTN, and obesity admitted for anasarca; hospital day 5.  Edward Page was examined and evaluated at bedside. He reports continuing interval improvement with diuresis and feels much more comfortable. Notes less swelling in legs and scrotum. BLE ROM continues to improve; scrotal pain much improved with getting fluid off and using scrotal support. Denies any chest pain, shortness of breath, dizziness, or light headedness. Discussed plan for continuing diuresis and kidney bx 4/19, patient is agreeable.   Objective:  Vital signs in last 24 hours: Vitals:   08/08/19 1155 08/08/19 2045 08/09/19 0300 08/09/19 0617  BP: (!) 138/100 126/86  (!) 137/92  Pulse: 94 91  88  Resp: 18 20  20   Temp: 98.5 F (36.9 C) 98.9 F (37.2 C)  97.8 F (36.6 C)  TempSrc: Oral Oral    SpO2: 97% 92%  91%  Weight:   (!) 147.6 kg   Height:       Weight change: -4.536 kg  Intake/Output Summary (Last 24 hours) at 08/09/2019 0847 Last data filed at 08/09/2019 0500 Gross per 24 hour  Intake 720 ml  Output 4950 ml  Net -4230 ml   General appearance: alert, cooperative, appears stated age and in no apparent distress Head: Normocephalic, without obvious abnormality, atraumatic Lungs: Normal work of breathing. CTAB Heart: Regular rate and rhythm, S1, S2 normal, no murmur, click, rub or gallop Abdomen: Soft, non-tender; bowel sounds normal; no masses, no organomegaly. Exam limited by obese habitus Male genitalia: Significant scrotal swelling with scrotal support in place Extremities: 2+ BLE pitting edema. BUE wnl Neurologic: Grossly normal Psych: Mood and affect normal   Assessment/Plan:  Principal Problem:   Nephrotic syndrome Active Problems:   Acute congestive heart failure [facility])  Summary: Edward Page is a 44 year old man with a past medical history of hypertension and diabetes who presents to the ED with a two week history of  bilateral leg swelling and dyspnea on exertion and a two day history of scrotal swelling and pain.  #Nephrotic Syndrome Patient still volume up on exam. Net negative ~4L over the past day and net negative ~17.5L cumulative throughout admission. Nephrotic syndrome labs all negative to date; ANCA and HCV pending. Currently on furosemide 80 mg bid. Creatinine 1.40--> 1.34 this morning. Vitals stable. Nephrology following with plan for renal bx 4/19. -Appreciate Nephrology recs: C/w lisinopril 20 mg daily, f/u labs, renal bx 4/19 -C/w furosemide 80 mg IV -C/w I/Os, daily weights -Monitor electrolytes and replete as needed - If reversible cause of proteinuria not found, can consider ppx anticoagulation per 2012 KDIGO guidelines (serum albumin <2.5, BMI >35; level 2C recommendation). Would most likely need warfarin d/t cost and insurance status  -Consider ordering Anti-PLA2R as add-on lab while inpatient  #T2DM HgA1c found to be 12.1%. On metformin at home. CBG 192 this a.m. -Start Lantus 5 units qhs once NPO status over (after bx 4/19) -SSI -CBGs -Continue atorvastatin  #HTN #PVCs Patient home anti-hypertensive regimen includes metoprolol tartrate 25 mg bid. Pressures reasonably well-controlled over past day. Also, cardiac monitoring has recorded a heavy PVC burden with ventricular trigeminy patten. Together, these justify restarting home metoprolol. -C/wmetoprolol tartrate 25 mg bid -C/w cardiac monitoring  Diet: Heart healthy/Carb modified DVT ppx: Lovenox (hold for bx tomorrow) Code: Full  Dispo: Pending clinical improvement; expected 2-3 days   LOS: 5 days   5/19, Medical Student 08/09/2019, 8:47 AM  Attestation for Student Documentation I personally was present and performed or re-performed the history, physical exam and medical decision-making activities of this service and have verified that the service and findings are accurately documented in the student's  note  Edward Page is a 44 yo M w/ PMH of DM, HTN admit for nephrotic syndrome. Continues to have good diuresis. Electrolytes wnl. Nephrology recommend biopsy for further work-up which is scheduled for tomorrow. Attempted to order Anti-PLA2R as further work-up but both nephrology and we could not find in Epic order set.  Gilberto Better, PGY2 Pager: [phone]

## 2019-08-10 ENCOUNTER — Inpatient Hospital Stay (HOSPITAL_COMMUNITY): Payer: BLUE CROSS/BLUE SHIELD

## 2019-08-10 LAB — RENAL FUNCTION PANEL
Albumin: 2.2 g/dL — ABNORMAL LOW (ref 3.5–5.0)
Anion gap: 12 (ref 5–15)
BUN: 18 mg/dL (ref 6–20)
CO2: 28 mmol/L (ref 22–32)
Calcium: 8.9 mg/dL (ref 8.9–10.3)
Chloride: 97 mmol/L — ABNORMAL LOW (ref 98–111)
Creatinine, Ser: 1.51 mg/dL — ABNORMAL HIGH (ref 0.61–1.24)
GFR calc Af Amer: >60 mL/min (ref >60)
GFR calc non Af Amer: 56 mL/min — ABNORMAL LOW (ref >60)
Glucose, Bld: 204 mg/dL — ABNORMAL HIGH (ref 70–99)
Phosphorus: 5.1 mg/dL — ABNORMAL HIGH (ref 2.5–4.6)
Potassium: 4.9 mmol/L (ref 3.5–5.1)
Sodium: 137 mmol/L (ref 135–145)

## 2019-08-10 LAB — HCV INTERPRETATION

## 2019-08-10 LAB — GLUCOSE, CAPILLARY
Glucose-Capillary: 179 mg/dL — ABNORMAL HIGH (ref 70–99)
Glucose-Capillary: 178 mg/dL — ABNORMAL HIGH (ref 70–99)
Glucose-Capillary: 193 mg/dL — ABNORMAL HIGH (ref 70–99)
Glucose-Capillary: 170 mg/dL — ABNORMAL HIGH (ref 70–99)
Glucose-Capillary: 218 mg/dL — ABNORMAL HIGH (ref 70–99)

## 2019-08-10 LAB — PROTIME-INR
INR: 1 (ref 0.8–1.2)
Prothrombin Time: 13.2 seconds (ref 11.4–15.2)

## 2019-08-10 LAB — MAGNESIUM: Magnesium: 2 mg/dL (ref 1.7–2.4)

## 2019-08-10 LAB — HCV AB W REFLEX TO QUANT PCR: HCV Ab: <0.1 s/co ratio (ref 0.0–0.9)

## 2019-08-10 MED ORDER — MIDAZOLAM HCL 2 MG/2ML IJ SOLN
INTRAMUSCULAR | Status: AC
  Filled 2019-08-10: qty 4

## 2019-08-10 MED ORDER — INSULIN GLARGINE 100 UNIT/ML ~~LOC~~ SOLN
5.0000 [IU] | Freq: Every day | SUBCUTANEOUS | Status: DC
  Administered 2019-08-10: 5 [IU] via SUBCUTANEOUS
  Filled 2019-08-10 (×2): qty 0.05

## 2019-08-10 MED ORDER — FUROSEMIDE 80 MG PO TABS
80.0000 mg | ORAL_TABLET | Freq: Two times a day (BID) | ORAL | Status: DC
  Administered 2019-08-10 – 2019-08-11 (×2): 80 mg via ORAL
  Filled 2019-08-10 (×2): qty 1

## 2019-08-10 MED ORDER — ENOXAPARIN SODIUM 80 MG/0.8ML ~~LOC~~ SOLN
80.0000 mg | SUBCUTANEOUS | Status: DC
  Administered 2019-08-10: 80 mg via SUBCUTANEOUS
  Filled 2019-08-10: qty 0.8

## 2019-08-10 MED ORDER — FUROSEMIDE 10 MG/ML IJ SOLN
80.0000 mg | Freq: Two times a day (BID) | INTRAMUSCULAR | Status: DC
  Administered 2019-08-10: 80 mg via INTRAVENOUS

## 2019-08-10 MED ORDER — MIDAZOLAM HCL 2 MG/2ML IJ SOLN
INTRAMUSCULAR | Status: AC | PRN
  Administered 2019-08-10 (×2): 1 mg via INTRAVENOUS

## 2019-08-10 MED ORDER — FENTANYL CITRATE (PF) 100 MCG/2ML IJ SOLN
INTRAMUSCULAR | Status: AC
  Filled 2019-08-10: qty 4

## 2019-08-10 MED ORDER — FENTANYL CITRATE (PF) 100 MCG/2ML IJ SOLN
INTRAMUSCULAR | Status: AC | PRN
  Administered 2019-08-10 (×2): 50 ug via INTRAVENOUS

## 2019-08-10 NOTE — Plan of Care (Signed)

## 2019-08-10 NOTE — Progress Notes (Signed)
Per update by pharmacy: Patient had a renal biopsy today that ended ~1045. He was given Lovenox 80 mg SQ at 1250. Given nature of procedure, Lovenox should have been held until tomorrow morning.   Discussed this with attending, Dr. Criselda Peaches. Currently concern for bleeding is low considering relative dose to patient BMI, but will closely observe biopsy site closely for signs of bleeding. Also plan to observe for potential adverse events such as perinephric hematoma. Edward Page was informed of this and counseled to monitor for symptoms such as hematuria and flank pain. Plan is to observe with no plan for reversal of anticoagulation given risks. Begin normal dose Lovenox again in a.m.

## 2019-08-10 NOTE — Progress Notes (Signed)
Physical Therapy Treatment Note  PT Notes:    Pt supine in bed on entry stating " I just got comfortable.", but ultimately agreeable to ambulation with therapy. Pt swelling is subsiding and pt with increased mobility, especially with use of scrotal sling. Pt continues to be limited in safe mobility by scrotal pain with ambulation and decreased balance due to compensatory gait with increased lateral sway. Pt is supervision for bed mobility, min guard for transfers and min guard for ambulation with use of Rollator, pt with 1x LoB requiring min A to steady when ambulating without AD. D/c plans remain appropriate. PT will continue to progress mobility acutely.      08/10/19 1616  PT Visit Information  Last PT Received On 08/10/19  Assistance Needed +1  History of Present Illness Pt is a 44 y/o male admitted secondary to increased LE and scrotal swelling likely from nephrotic syndrome. PMH includes DM and HTN.   Subjective Data  Patient Stated Goal to decrease pain   Precautions  Precautions Fall  Precaution Comments scotal sling present  Required Braces or Orthoses Other Brace  Other Brace scrotal sling  Restrictions  Weight Bearing Restrictions No  Pain Assessment  Faces Pain Scale 2  Pain Location scrotum with bed mobility  Pain Descriptors / Indicators Grimacing;Guarding  Pain Intervention(s) Limited activity within patient's tolerance;Monitored during session;Repositioned  Cognition  Arousal/Alertness Awake/alert  Behavior During Therapy WFL for tasks assessed/performed  Overall Cognitive Status Within Functional Limits for tasks assessed  Bed Mobility  Overal bed mobility Needs Assistance  Bed Mobility Supine to Sit  Rolling Supervision  Supine to sit Supervision  Sit to supine Supervision  General bed mobility comments use of rails  Transfers  Overall transfer level Needs assistance  Equipment used 4-wheeled walker  Transfers Sit to/from Stand  Sit to Stand Min guard   General transfer comment min guard for safety with power up and steadying before reaching to Rollator  Ambulation/Gait  Ambulation/Gait assistance Min guard  Gait Distance (Feet) 250 Feet  Assistive device 4-wheeled walker  Gait Pattern/deviations Step-through pattern;Decreased step length - right;Decreased step length - left;Wide base of support;Trunk flexed;Antalgic  General Gait Details min guard for safety, much narrower BoS, and less lateral sway with ambulation today using Rollator, final 75 feet pt requested to ambulate without AD continued with min guard with 1x LoB requiring minA to steady  Gait velocity slowed  Gait velocity interpretation 1.31 - 2.62 ft/sec, indicative of limited community ambulator  Balance  Overall balance assessment Needs assistance  Sitting-balance support Feet supported;No upper extremity supported  Sitting balance-Leahy Scale Fair  Sitting balance - Comments balance decreased by fact that pt so far on the EoB   Standing balance support During functional activity;No upper extremity supported  Standing balance-Leahy Scale Fair  Standing balance comment able to perform sit>stand without outside assist  General Comments  General comments (skin integrity, edema, etc.) Pt appreciative of scrotal sling for ambulation   Exercises  Exercises Other exercises  Other Exercises  Other Exercises sit>stand x 8  (initially pt requires bracing LE on bed to achieve upright)  PT - End of Session  Equipment Utilized During Treatment Gait belt  Activity Tolerance Patient limited by pain  Patient left in bed;with call bell/phone within reach;with family/visitor present  Nurse Communication Mobility status   PT - Assessment/Plan  PT Plan Current plan remains appropriate  PT Visit Diagnosis Muscle weakness (generalized) (M62.81);Pain  Pain - part of body  (scrotum)  PT  Frequency (ACUTE ONLY) Min 3X/week  Follow Up Recommendations Supervision for mobility/OOB (TBD  pending progression )  PT equipment Other (comment) (TBD)  AM-PAC PT "6 Clicks" Mobility Outcome Measure (Version 2)  Help needed turning from your back to your side while in a flat bed without using bedrails? 4  Help needed moving from lying on your back to sitting on the side of a flat bed without using bedrails? 4  Help needed moving to and from a bed to a chair (including a wheelchair)? 4  Help needed standing up from a chair using your arms (e.g., wheelchair or bedside chair)? 4  Help needed to walk in hospital room? 4  Help needed climbing 3-5 steps with a railing?  2  6 Click Score 22  Consider Recommendation of Discharge To: Home with no services  PT Goal Progression  Progress towards PT goals Progressing toward goals  Acute Rehab PT Goals  PT Goal Formulation With patient  Time For Goal Achievement 08/20/19  Potential to Achieve Goals Good  PT Time Calculation  PT Start Time (ACUTE ONLY) 1359  PT Stop Time (ACUTE ONLY) 1413  PT Time Calculation (min) (ACUTE ONLY) 14 min  PT General Charges  $$ ACUTE PT VISIT 1 Visit  PT Treatments  $Gait Training 8-22 mins   Arnett Galindez B. Migdalia Dk PT, DPT Acute Rehabilitation Services Pager [phone] Office [phone]

## 2019-08-10 NOTE — Progress Notes (Signed)
Subjective: Edward Page is a 44 year old man with PMH DM (~5 years), HTN, and obesity admitted for anasarca; hospital day 5.  Edward Page was examined and evaluated at bedside. There is continued improvement with fluid overload with diuresis. Scrotal swelling is now minimal and not causing significant discomfort with the aid of scrotal support. Legs also continue to decrease swelling with improved ROM. Denies any symptoms of fluid overload such as SOB. Plan for renal bx today. Discussed plan for bx and continued diuresis, to which patient is agreeable.  Objective:  Vital signs in last 24 hours: Vitals:   08/10/19 0509 08/10/19 1020 08/10/19 1023 08/10/19 1033  BP: (!) 140/93 (!) 141/106 (!) 141/101 (!) 141/97  Pulse: 92 86 84 87  Resp: 18 16 16 18   Temp: 98.4 F (36.9 C)     TempSrc:      SpO2: 93% 94% 98% 99%  Weight:      Height:       Weight change: -4.218 kg  Intake/Output Summary (Last 24 hours) at 08/10/2019 1041 Last data filed at 08/10/2019 0238 Gross per 24 hour  Intake 1460 ml  Output 3602 ml  Net -2142 ml   General appearance: alert, cooperative, appears stated age and in no apparent distress Head: Normocephalic, without obvious abnormality, atraumatic Lungs: Normal work of breathing. CTAB Heart: Regular rate and rhythm, S1, S2 normal, no murmur, click, rub or gallop Abdomen: Soft, non-tender; bowel sounds normal; no masses, no organomegaly. Exam limited by obese habitus Male genitalia: Mild scrotal swelling. Scrotal support in place Extremities: 2+ BLE pitting edema. BUE wnl Neurologic: Grossly normal Psych: Mood and affect normal   Assessment/Plan:  Principal Problem:   Nephrotic syndrome Active Problems:   Acute congestive heart failure [facility])  Summary: Edward Page is a 44 year old man with a past medical history of hypertension and diabetes who presents to the ED with a two week history of bilateral leg swelling and dyspnea on exertion and a two  day history of scrotal swelling and pain.  #Nephrotic Syndrome Patient still volume up on exam. Net negative 2.9L over the past day and net negative ~20.4L cumulative throughout admission. Weight down from 161.8 kg on admission to 143.4 Nephrotic syndrome labs all negative to date; ANCA pending. Anti-PLA2R Ab lab not available inpatient for membranous nephropathy. Currently on furosemide 80 mg bid. Creatinine 1.34--> 1.51 this morning with stable electrolytes. Vitals unremarkable. Nephrology following with plan for renal bx today. -Appreciate Nephrology recs: C/w lisinopril 20 mg daily, f/u labs, f/u renal bx  -C/w furosemide 80 mg IV -C/w I/Os, daily weights -Monitor electrolytes and replete as needed - If reversible cause of proteinuria not found, can consider ppx anticoagulation per 2012 KDIGO guidelines (serum albumin <2.5, BMI >35; level 2C recommendation). Would most likely need warfarin d/t cost and insurance status   #T2DM HgA1c found to be 12.1%. On metformin at home. CBG 178 this a.m. -Start Lantus 5 units qhs once NPO status over (after bx) with appropriate oral intake -SSI -CBGs -Continue atorvastatin  #HTN #PVCs Patient home anti-hypertensive regimen includes metoprolol tartrate 25 mg bid. Pressures reasonably well-controlled over past day. Also, cardiac monitoring has recorded a heavy PVC burden with ventricular trigeminy patten. Together, these justify restarting home metoprolol. -C/w metoprolol tartrate 25 mg bid -C/w cardiac monitoring  Diet: Heart healthy/Carb modified DVT ppx: Lovenox Code: Full  Dispo: Pending clinical improvement; expected 2-3 days   LOS: 6 days   2013, Medical  Student 08/10/2019, 10:41 AM

## 2019-08-10 NOTE — Progress Notes (Signed)
OT Cancellation Note  Patient Details Name: Edward Page MRN: [id number] DOB: 30-Jul-1975   Cancelled Treatment:    Reason Eval/Treat Not Completed: Patient at procedure or test/ unavailable(Pt having biopsy performed. OT to continue to follow.)   Flora Lipps, OTR/L Acute Rehabilitation Services Pager: [phone] Office: [phone]   Cheney Gosch C 08/10/2019, 10:20 AM

## 2019-08-10 NOTE — Consult Note (Signed)
[facility] Progress Note    Assessment/ Plan:   1. Nephrotic syndrome: ongoing workup.  Work-up so far has not revealed the source of his nephrotic syndrome.  ANA, ANCA antibodies are negative, hepatitis B and C are negative.  DDx includes: diabetic nephropathy, FSGS, IgA nephropathy, membranoproliferative glomerulonephritis.  lupus is unlikely based on negative ANA.  Further studies pending. Plan for renal biopsy today. 2. Hypervolemia: Improving.  Secondary to nephrotic syndrome and hypoalbuminemia. 4.7L UOP on 4/18. Net negative 20L per hospitalization so far. Cr increase today to 1.5 form 1.3. Potassium WNL.  We will plan for 1 more day of IV diuresis and likely transition to p.o. diuresis tomorrow. 3. DM2 - well controlled with moderate SSI. Likely contributing to above noted nephropathy.  Management per primary team. 4. HTN -1 elevated diastolic pressure up to 196 in the past 24 hours.  Otherwise unremarkable.  Continue lisinopril and metoprolol. Management per primary team. 5. Hold anticoagulation until after the biopsy.  Following his biopsy, he can receive routine DVT prophylaxis.   Subjective:   No acute events overnight.  No new complaints or concerns this morning.  He believes that his edema is overall improving.  He has never noted significant upper extremity edema but his lower extremities have been losing significant edema in his scrotal swelling has also significantly improved during this hospitalization.   Objective:   BP (!) 135/96 (BP Location: Left Arm)   Pulse 80   Temp 98.2 F (36.8 C) (Oral)   Resp 18   Ht _0  (1.753 m)   Wt (!) 143.4 kg   SpO2 92%   BMI 46.69 kg/m   Intake/Output Summary (Last 24 hours) at 08/10/2019 1207 Last data filed at 08/10/2019 2229 Gross per 24 hour  Intake 1460 ml  Output 3002 ml  Net -1542 ml   Weight change: -4.218 kg  Physical Exam: General: Alert and cooperative and appears to be in no acute distress.   Resting in bed comfortably. HEENT: Neck non-tender without lymphadenopathy, masses or thyromegaly Cardio: Normal S1 and S2, no S3 or S4. Rhythm is regular. No murmurs or rubs.   Pulm: Clear to auscultation bilaterally, no crackles, wheezing, or diminished breath sounds. Normal respiratory effort.  Breathing comfortably on room air. Abdomen: Bowel sounds normal. Abdomen soft and non-tender.  Extremities: No swelling noted in upper extremities all the dependent areas are edematous.  Lower extremity edema noted throughout his legs. Neuro: Cranial nerves grossly intact   Imaging: No results found.  Labs: BMET Recent Labs  Lab 08/04/19 0246 08/05/19 0445 08/06/19 0611 08/07/19 0430 08/08/19 0311 08/09/19 0359 08/10/19 0456  NA 139 140 140 139 137 138 137  K 3.5 3.0* 3.3* 3.7 4.2 4.4 4.9  CL 103 103 99 98 98 99 97*  CO2 _1 32 31 28  GLUCOSE 259* 205* 199* 210* 249* 192* 204*  BUN _2 CREATININE 1.30* 1.16 1.20 1.31* 1.40* 1.34* 1.51*  CALCIUM 8.5* 8.1* 8.3* 8.3* 8.2* 8.6* 8.9  PHOS  --   --   --  4.3 4.3 4.4 5.1*   CBC Recent Labs  Lab 08/04/19 0246 08/05/19 0445 08/06/19 0611 08/09/19 0359  WBC 8.8 6.4 7.0 7.4  HGB 12.6* 11.7* 12.2* 12.4*  HCT 40.8 37.0* 38.0* 39.8  MCV 81.8 80.3 80.2 80.7  PLT 288 239 253 258    Medications:    . atorvastatin  40 mg Oral Daily  .  fentaNYL      . furosemide  80 mg Intravenous BID  . insulin aspart  0-15 Units Subcutaneous TID WC  . insulin starter kit- pen needles  1 kit Other Once  . lisinopril  20 mg Oral Daily  . living well with diabetes book   Does not apply Once  . magnesium oxide  400 mg Oral TID  . metoprolol tartrate  25 mg Oral BID  . midazolam      . potassium chloride  40 mEq Oral BID     Matilde Haymaker, MD [place] Resident, PGY2 08/10/2019, 12:07 PM

## 2019-08-10 NOTE — Progress Notes (Signed)
Occupational Therapy Treatment Patient Details Name: Edward Page MRN: [id number] DOB: [birth year] Today's Date: 08/10/2019    History of present illness Pt is a 44 y/o male admitted secondary to increased LE and scrotal swelling likely from nephrotic syndrome. PMH includes DM and HTN.    OT comments  Pt in the middle of bath- pt taking steps to [facility] with 1 HHA. Pt with LOB episode, but able to right reactions. OT addressing scrotal sling wear schedule and stated how to wash. Pt modA for LB simulation of donning pants. Pt would benefit from continued OT skilled services. OT following acutely.    Follow Up Recommendations  No OT follow up    Equipment Recommendations  None recommended by OT    Recommendations for Other Services      Precautions / Restrictions Precautions Precautions: Fall Precaution Comments: scotal sling present Required Braces or Orthoses: Other Brace Other Brace: scrotal sling Restrictions Weight Bearing Restrictions: No       Mobility Bed Mobility Overal bed mobility: Needs Assistance Bed Mobility: Supine to Sit Rolling: Supervision   Supine to sit: Supervision     General bed mobility comments: use of rails  Transfers Overall transfer level: Needs assistance Equipment used: 1 person hand held assist Transfers: Sit to/from Stand Sit to Stand: Min guard         General transfer comment: MinguardA from bed; no assist for power up. Encouragement to avoid taking steps untl feet planted on grounf.    Balance Overall balance assessment: Needs assistance Sitting-balance support: Feet supported;No upper extremity supported Sitting balance-Leahy Scale: Fair     Standing balance support: Single extremity supported;During functional activity Standing balance-Leahy Scale: Fair Standing balance comment: taking steps to [facility]                           ADL either performed or assessed with clinical judgement   ADL Overall ADL's : Needs  assistance/impaired         Upper Body Bathing: Independent;Sitting   Lower Body Bathing: Minimal assistance;Sit to/from stand;Sitting/lateral leans Lower Body Bathing Details (indicate cue type and reason): Assist from spouse for task     Lower Body Dressing: Moderate assistance;Sitting/lateral leans;Sit to/from stand Lower Body Dressing Details (indicate cue type and reason): Pt unable to lift BLEs to address LB dressing. Pt able to simulate pulling pants to waist from knees             Functional mobility during ADLs: Min guard;Rolling walker General ADL Comments: Pt in the middle of bath- pt taking steps to [facility] with 1 HHA. Pt with LOB episode, but able to right reactions. OT addressing scrotal sling wear schedule and stated how to wash.      Vision   Vision Assessment?: No apparent visual deficits   Perception     Praxis      Cognition Arousal/Alertness: Awake/alert Behavior During Therapy: WFL for tasks assessed/performed Overall Cognitive Status: Within Functional Limits for tasks assessed                                          Exercises     Shoulder Instructions       General Comments Pt with proper positioning of scrotal sling.     Pertinent Vitals/ Pain       Pain Assessment: No/denies pain  Home Living  Prior Functioning/Environment              Frequency  Min 2X/week        Progress Toward Goals  OT Goals(current goals can now be found in the care plan section)  Progress towards OT goals: Progressing toward goals  Acute Rehab OT Goals Patient Stated Goal: to decrease pain  OT Goal Formulation: With patient Time For Goal Achievement: 08/21/19 Potential to Achieve Goals: Good ADL Goals Pt Will Perform Lower Body Bathing: sit to/from stand;sitting/lateral leans;with modified independence Pt Will Perform Lower Body Dressing: with modified  independence;sitting/lateral leans;sit to/from stand Pt Will Transfer to Toilet: with modified independence;regular height toilet;ambulating Additional ADL Goal #1: Pt will recall wearing schedule and benefits of scrotal sling wear for increased BADL participation  Plan Discharge plan remains appropriate    Co-evaluation                 AM-PAC OT "6 Clicks" Daily Activity     Outcome Measure   Help from another person eating meals?: None Help from another person taking care of personal grooming?: None Help from another person toileting, which includes using toliet, bedpan, or urinal?: A Little Help from another person bathing (including washing, rinsing, drying)?: A Little Help from another person to put on and taking off regular upper body clothing?: None Help from another person to put on and taking off regular lower body clothing?: A Little 6 Click Score: 21    End of Session    OT Visit Diagnosis: Unsteadiness on feet (R26.81);Pain Pain - part of body: (scrotum at times)   Activity Tolerance Patient tolerated treatment well   Patient Left in bed;with call bell/phone within reach;with family/visitor present   Nurse Communication Mobility status        Time: 2376-2831 OT Time Calculation (min): 12 min  Charges: OT General Charges $OT Visit: 1 Visit OT Treatments $Self Care/Home Management : 8-22 mins  Flora Lipps, OTR/L Acute Rehabilitation Services Pager: [phone] Office: [phone]    Edward Page 08/10/2019, 4:14 PM

## 2019-08-10 NOTE — Progress Notes (Signed)
Biopsy site without bleeding or distress,patient denies pain or shortness of breath. VS stable after procedure. Patient laughing and talking with family at bedside.

## 2019-08-10 NOTE — Progress Notes (Signed)
Pharmacy - Lovenox  Patient had a renal biopsy today that ended around 1045. Lovenox 80 mg SQ was ordered and given at 1250. This is a high bleeding risk procedure and the lovenox should have been delayed until tomorrow morning. Discussed with Illene Regulus, medical student from [facility]. No new orders were given. Plan is to monitor for any signs or symptoms of bleeding. [facility] discussed with patient.     Zamira Hickam L. Arlester Marker, PharmD [facility] PGY1 Pharmacy Resident 08/10/19      3:00 PM  Please check AMION for all [facility] phone numbers After 10:00 PM, call the Main Pharmacy [phone]

## 2019-08-10 NOTE — Procedures (Signed)
Pre Procedure Dx: Nephrotic Syndrome. Post Procedural Dx: Same  Technically successful US guided biopsy of inferior pole of the right kidney.  EBL: None  No immediate complications.   Katherina Right, MD Pager #: [phone]

## 2019-08-11 DIAGNOSIS — I5030 Unspecified diastolic (congestive) heart failure: Secondary | ICD-10-CM

## 2019-08-11 DIAGNOSIS — I110 Hypertensive heart disease with heart failure: Secondary | ICD-10-CM

## 2019-08-11 LAB — GLUCOSE, CAPILLARY
Glucose-Capillary: 225 mg/dL — ABNORMAL HIGH (ref 70–99)
Glucose-Capillary: 170 mg/dL — ABNORMAL HIGH (ref 70–99)

## 2019-08-11 LAB — RENAL FUNCTION PANEL
Albumin: 2.2 g/dL — ABNORMAL LOW (ref 3.5–5.0)
Anion gap: 9 (ref 5–15)
BUN: 23 mg/dL — ABNORMAL HIGH (ref 6–20)
CO2: 29 mmol/L (ref 22–32)
Calcium: 8.6 mg/dL — ABNORMAL LOW (ref 8.9–10.3)
Chloride: 99 mmol/L (ref 98–111)
Creatinine, Ser: 1.48 mg/dL — ABNORMAL HIGH (ref 0.61–1.24)
GFR calc Af Amer: >60 mL/min (ref >60)
GFR calc non Af Amer: 57 mL/min — ABNORMAL LOW (ref >60)
Glucose, Bld: 212 mg/dL — ABNORMAL HIGH (ref 70–99)
Phosphorus: 5.1 mg/dL — ABNORMAL HIGH (ref 2.5–4.6)
Potassium: 4.9 mmol/L (ref 3.5–5.1)
Sodium: 137 mmol/L (ref 135–145)

## 2019-08-11 LAB — CBC
HCT: 43.1 % (ref 39.0–52.0)
Hemoglobin: 13.6 g/dL (ref 13.0–17.0)
MCH: 25 pg — ABNORMAL LOW (ref 26.0–34.0)
MCHC: 31.6 g/dL (ref 30.0–36.0)
MCV: 79.2 fL — ABNORMAL LOW (ref 80.0–100.0)
Platelets: 310 10*3/uL (ref 150–400)
RBC: 5.44 MIL/uL (ref 4.22–5.81)
RDW: 12.8 % (ref 11.5–15.5)
WBC: 7.5 10*3/uL (ref 4.0–10.5)
nRBC: 0 % (ref 0.0–0.2)

## 2019-08-11 LAB — MAGNESIUM: Magnesium: 2 mg/dL (ref 1.7–2.4)

## 2019-08-11 MED ORDER — INSULIN NPH ISOPHANE & REGULAR (70-30) 100 UNIT/ML ~~LOC~~ SUSP: 6.0000 [IU] | Freq: Two times a day (BID) | SUBCUTANEOUS | 1 refills | Status: AC

## 2019-08-11 MED ORDER — ATORVASTATIN CALCIUM 40 MG PO TABS: 40.0000 mg | ORAL_TABLET | Freq: Every day | ORAL | 0 refills | Status: AC

## 2019-08-11 MED ORDER — "INSULIN SYRINGE-NEEDLE U-100 29G X 1/2"" 0.3 ML MISC": 1.0000 "application " | Freq: Two times a day (BID) | 0 refills | Status: AC

## 2019-08-11 MED ORDER — LISINOPRIL 20 MG PO TABS: 20.0000 mg | ORAL_TABLET | Freq: Every day | ORAL | 0 refills | Status: DC

## 2019-08-11 MED ORDER — FUROSEMIDE 80 MG PO TABS: 80.0000 mg | ORAL_TABLET | Freq: Two times a day (BID) | ORAL | 0 refills | Status: DC

## 2019-08-11 NOTE — TOC Transition Note (Addendum)
Transition of Care [facility]) - CM/SW Discharge Note   Patient Details  Name: Avan Gullett MRN: [id number] Date of Birth: [birth year]  Transition of Care [facility]) CM/SW Contact:  Leone Haven, RN Phone Number: 08/11/2019, 1:23 PM   Clinical Narrative:    Patient is for dc today, he states he has insurance and can afford his medications and he has transportation, he also would like to do outpatient pt in [place], will need script from MD.  NCM will fax script to [facility],  [address] Octavio Manns .  Phone [phone].    Final next level of care: Home/Self Care Barriers to Discharge: No Barriers Identified   Patient Goals and CMS Choice Patient states their goals for this hospitalization and ongoing recovery are:: get better   Choice offered to / list presented to : NA  Discharge Placement                       Discharge Plan and Services                          HH Arranged: NA          Social Determinants of Health (SDOH) Interventions     Readmission Risk Interventions No flowsheet data found.

## 2019-08-11 NOTE — Plan of Care (Signed)
Nutrition Education Note  RD consulted for nutrition education regarding low sodium diet and diabetes.  Lab Results  Component Value Date   HGBA1C 12.1 (H) 08/04/2019   PTA DM medications are 500 mg metformin BID.   Labs reviewed: 170-218 (inpatient orders for glycemic control are 0-5 units insulin aspart q HS and 0-15 units insulin aspart TID with meals).    Case discussed with RN, who reports pt is newly diagnosed DM and nephrotic syndrome. Pt underwent renal biopsy on 08/10/19; potential discharge home today.   Spoke with pt, who was sitting in recliner chair at time of visit. Pt pleasant and in good spirits, verbalized he needed to make lifestyle changes "to get my life and health back on track". Pt shares that he was diagnosed with DM a few years ago and admittedly did not follow self-management recommendations. Pt was recently started on metformin a few days PTA and will likely require insulin for discharge. Pt reports he will take insulin, but "doesn't trust the pen" due to larger needle. This was communicated with RN who reports she will work with pt getting comfortable with insulin injections. RD provided emotional support.  Pt works third shift and often has a variable work schedule. He consumes 3 meals per day (First meal: subway sandwich with chips; Second meal: meat, starch, and vegetable, Third meal: bacon and eggs). Pt often snacks on potato chips and nuts and drinks water, diet soda, and unsweetened drink mixes. Pt expressed concern about remembering to make medications and checking his blood sugar- RD reviewed strategies to help him be more compliant, such as setting a reminder on his phone or keeping his supplies in a familiar place that will prompt him to take it.   Focus on conversation was ways to reduce sodium and carbohydrates in diet as well as importance of DM self-management. Pt very appreciative of his visit. He shares his wife and family are very supportive and are  adapting their cooking methods to provide him healthier meals.   RD provided "Heart Healthy, Consistent Carbohydrate Nutrition Therapy" handout from the Academy of [employer]etics. Reviewed patient's dietary recall. Provided examples on ways to decrease sodium intake in diet. Discouraged intake of processed foods and use of salt shaker. Encouraged fresh fruits and vegetables as well as whole grain sources of carbohydrates to maximize fiber intake.   RD discussed why it is important for patient to adhere to diet recommendations, and emphasized the role of fluids, foods to avoid, and importance of weighing self daily.   Discussed different food groups and their effects on blood sugar, emphasizing carbohydrate-containing foods. Provided list of carbohydrates and recommended serving sizes of common foods.  Discussed importance of controlled and consistent carbohydrate intake throughout the day. Provided examples of ways to balance meals/snacks and encouraged intake of high-fiber, whole grain complex carbohydrates. Teach back method used.  Expect fair to good compliance.  Current diet order is renal/ carb modified with 1.2 L fluid restriction, patient is consuming approximately 100% of meals at this time. Labs and medications reviewed. No further nutrition interventions warranted at this time. RD contact information provided. If additional nutrition issues arise, please re-consult RD.   Levada Schilling, RD, LDN, CDCES Registered Dietitian II Certified Diabetes Care and Education Specialist Please refer to [facility] for RD and/or RD on-call/weekend/after hours pager

## 2019-08-11 NOTE — Progress Notes (Signed)
Diet changed to correlate with care.

## 2019-08-11 NOTE — Progress Notes (Signed)
Physical Therapy Treatment Patient Details Name: Edward Page MRN: [id number] DOB: 1976-03-09 Today's Date: 08/11/2019    History of Present Illness Pt is a 44 y/o male admitted secondary to increased LE and scrotal swelling likely from nephrotic syndrome. PMH includes DM and HTN.     PT Comments    Patient received in bed, agrees to PT session. Reports no pain this day. Wants to go home. He is independent with bed mobility and transfers, ambulated 175 feet with bariatric rollator, then 75 feet without AD. He is steadier and more confident with rollator, demonstrates increased lateral sway without ad and decreased cadence. Patient will continue to benefit from skilled PT while here to improve balance and safety with mobility.     Follow Up Recommendations  Outpatient PT;Supervision - Intermittent     Equipment Recommendations  Other (comment)(bariatric rollator)    Recommendations for Other Services       Precautions / Restrictions Precautions Precautions: Fall Precaution Comments: mod fall, scrotal sling Restrictions Weight Bearing Restrictions: No    Mobility  Bed Mobility Overal bed mobility: Independent Bed Mobility: Supine to Sit     Supine to sit: Independent        Transfers Overall transfer level: Independent Equipment used: 4-wheeled walker Transfers: Sit to/from Stand Sit to Stand: Independent         General transfer comment: patient received standing when I entered room after getting extra gown  Ambulation/Gait Ambulation/Gait assistance: Supervision Gait Distance (Feet): 250 Feet Assistive device: 4-wheeled walker Gait Pattern/deviations: Step-through pattern;Wide base of support Gait velocity: slowed   General Gait Details: increased lateral sway with ambulation, ambulated most of walk with 4 wheeled walker, last 75 feet without AD, no LOB but increased unsteadiness, slower pace.   Stairs             Wheelchair Mobility     Modified Rankin (Stroke Patients Only)       Balance Overall balance assessment: Modified Independent;Mild deficits observed, not formally tested Sitting-balance support: Feet supported Sitting balance-Leahy Scale: Good     Standing balance support: Bilateral upper extremity supported;During functional activity;No upper extremity supported Standing balance-Leahy Scale: Fair Standing balance comment: ambulated 75 feet without AD                            Cognition Arousal/Alertness: Awake/alert Behavior During Therapy: WFL for tasks assessed/performed Overall Cognitive Status: Within Functional Limits for tasks assessed                                        Exercises      General Comments        Pertinent Vitals/Pain Pain Assessment: No/denies pain    Home Living                      Prior Function            PT Goals (current goals can now be found in the care plan section) Acute Rehab PT Goals Patient Stated Goal: to return home PT Goal Formulation: With patient Time For Goal Achievement: 08/20/19 Potential to Achieve Goals: Good Progress towards PT goals: Progressing toward goals    Frequency    Min 3X/week      PT Plan Discharge plan needs to be updated    Co-evaluation  AM-PAC PT "6 Clicks" Mobility   Outcome Measure  Help needed turning from your back to your side while in a flat bed without using bedrails?: None Help needed moving from lying on your back to sitting on the side of a flat bed without using bedrails?: None Help needed moving to and from a bed to a chair (including a wheelchair)?: None Help needed standing up from a chair using your arms (e.g., wheelchair or bedside chair)?: None Help needed to walk in hospital room?: A Little Help needed climbing 3-5 steps with a railing? : A Little 6 Click Score: 22    End of Session   Activity Tolerance: Patient tolerated treatment  well Patient left: in bed;with call bell/phone within reach Nurse Communication: Mobility status PT Visit Diagnosis: Muscle weakness (generalized) (M62.81);Unsteadiness on feet (R26.81)     Time: 2023-3435 PT Time Calculation (min) (ACUTE ONLY): 15 min  Charges:  $Gait Training: 8-22 mins                     Sharanda Shinault, PT, GCS 08/11/19,10:20 AM

## 2019-08-11 NOTE — Consult Note (Signed)
[facility] Progress Note    Assessment/ Plan:   1. Nephrotic syndrome: No new information from the work-up so far.  Biopsy performed yesterday without complication.  Results will not be back for several days.  Blood work taken in hospital has not showed any clear source of his nephrotic syndrome.  DDx includes DN, FSGS, IgA nephropathy, membranoproliferative disease.  At this point, we have completed the hospital work-up and further work-up can be done in the outpatient setting.  His hemoglobin remained stable following his biopsy.  From a nephrology perspective, he has stable for discharge and hospital follow-up. 2. Hypervolemia: Improving.  Secondary to nephrotic syndrome.  1.5 L UOP yesterday.  Net -22 L since hospital admission.  Physical exam shows marked improvement.  Creatinine remains stable around 1.4-1.5.  Yesterday, he was transitioned to p.o. Lasix 80 mg twice daily.  We will continue Lasix at discharge. 3. DM2 -well controlled.  Management per primary team. 4. HTN - elevated diastolic pressures in the 90s in the past 24 hours.  Currently managed on lisinopril and metoprolol.  We will continue lisinopril metoprolol for now.  We will plan to maximize lisinopril in the outpatient setting for renal benefit.  From a renal perspective, no further work-up is necessary in the hospital.  He can follow-up with nephrology in the outpatient setting.   Subjective:   Renal biopsy went well yesterday without complication.  No acute events overnight.  This morning, Edward Page feels well and continues to have improved mobility as his swelling decreases.  He notes that it is now easier for him to work with PT and OT now that his scrotal swelling has decreased.  He still has some edema in his lower extremities but it is much less than prior to admission.   Objective:   BP 121/86 (BP Location: Left Arm)   Pulse 81   Temp 98.3 F (36.8 C) (Oral)   Resp 17   Ht '5\' 9"'$  (1.753 m)   Wt  (!) 139 kg Comment: scale b  SpO2 100%   BMI 45.25 kg/m   Intake/Output Summary (Last 24 hours) at 08/11/2019 1244 Last data filed at 08/11/2019 0853 Gross per 24 hour  Intake 940 ml  Output 1925 ml  Net -985 ml   Weight change: -4.445 kg  Physical Exam: General: Alert and cooperative and appears to be in no acute distress.  Sitting comfortably in his chair. HEENT: Moist mucous membranes. Cardio: Normal S1 and S2, no S3 or S4. Rhythm is regular. No murmurs or rubs.   Pulm: Clear to auscultation bilaterally, no crackles, wheezing, or diminished breath sounds. Normal respiratory effort Abdomen: Bowel sounds normal. Abdomen soft and non-tender.  Extremities: 2+ pitting edema still noted up to the mid thigh.  No edema noted in the upper extremities, abdomen or pelvis.  Skin is warm, dry. Neuro: Cranial nerves grossly intact    Imaging: US BIOPSY (KIDNEY)  Result Date: 08/10/2019 INDICATION: Naproxen drum of uncertain etiology. Please perform ultrasound-guided liver biopsy for tissue diagnostic purposes. EXAM: ULTRASOUND GUIDED RENAL BIOPSY COMPARISON:  Renal ultrasound-08/05/2019 MEDICATIONS: None. ANESTHESIA/SEDATION: Fentanyl 100 mcg IV; Versed 2 mg IV Total Moderate Sedation time: 11 minutes; The patient was continuously monitored during the procedure by the interventional radiology nurse under my direct supervision. COMPLICATIONS: None immediate. PROCEDURE: Informed written consent was obtained from the patient after a discussion of the risks, benefits and alternatives to treatment. The patient understands and consents the procedure. A timeout was performed prior to  the initiation of the procedure. Ultrasound scanning was performed of the bilateral flanks., similar to preceding renal ultrasound, the left kidney was again not identified sonographically. As such, the inferior pole of the right kidney was selected for biopsy due to location and sonographic window. The procedure was planned.  The operative site was prepped and draped in the usual sterile fashion. The overlying soft tissues were anesthetized with 1% lidocaine with epinephrine. A 17 gauge core needle biopsy device was advanced into the inferior cortex of the left kidney and 2 core biopsies were obtained under direct ultrasound guidance. Images were saved for documentation purposes. The biopsy device was removed and hemostasis was obtained with manual compression. Post procedural scanning was negative for significant post procedural hemorrhage or additional complication. A dressing was placed. The patient tolerated the procedure well without immediate post procedural complication. IMPRESSION: 1. Technically successful ultrasound guided right renal biopsy. 2. Nonvisualization of the left kidney, potentially secondary to body habitus though could be seen in the setting of congenital absence. Further evaluation with noncontrast CT scan the abdomen pelvis could be performed as indicated. Electronically Signed   By: Sandi Mariscal M.D.   On: 08/10/2019 12:26    Labs: BMET Recent Labs  Lab 08/05/19 0445 08/06/19 4235 08/07/19 0430 08/08/19 0311 08/09/19 0359 08/10/19 0456 08/11/19 0504  NA 140 140 139 137 138 137 137  K 3.0* 3.3* 3.7 4.2 4.4 4.9 4.9  CL 103 99 98 98 99 97* 99  CO2 _0 32 _1 GLUCOSE 205* 199* 210* 249* 192* 204* 212*  BUN _2 23*  CREATININE 1.16 1.20 1.31* 1.40* 1.34* 1.51* 1.48*  CALCIUM 8.1* 8.3* 8.3* 8.2* 8.6* 8.9 8.6*  PHOS  --   --  4.3 4.3 4.4 5.1* 5.1*   CBC Recent Labs  Lab 08/05/19 0445 08/06/19 0611 08/09/19 0359 08/11/19 1017  WBC 6.4 7.0 7.4 7.5  HGB 11.7* 12.2* 12.4* 13.6  HCT 37.0* 38.0* 39.8 43.1  MCV 80.3 80.2 80.7 79.2*  PLT 239 253 258 310    Medications:    . atorvastatin  40 mg Oral Daily  . furosemide  80 mg Oral BID  . insulin aspart  0-15 Units Subcutaneous TID WC  . insulin glargine  5 Units Subcutaneous QHS  . insulin starter kit- pen  needles  1 kit Other Once  . lisinopril  20 mg Oral Daily  . living well with diabetes book   Does not apply Once  . magnesium oxide  400 mg Oral TID  . metoprolol tartrate  25 mg Oral BID  . potassium chloride  40 mEq Oral BID     Matilde Haymaker, MD [place] Resident, PGY2 08/11/2019, 12:44 PM

## 2019-08-11 NOTE — Progress Notes (Signed)
Subjective: Edward Page is a 44 year old man with PMH DM (~5 years), HTN, and obesity admitted for anasarca. Hospital day 7.   Edward Page was examined and evaluated in room sitting up in chair. There is continued improvement with fluid overload with diuresis. Patient endorses marked improvement in legs and scrotal swelling with some remaining edema. ROM of legs continues to improve. No more complaints of scrotal edema. Denies SOB, hematuria, and flank pain. No signs of bleeding present at biopsy site. Plan for discharge today pending approval from Nephrology team. Edward Page is agreeable to plan.  Objective:  Vital signs in last 24 hours: Vitals:   08/10/19 1330 08/10/19 1400 08/10/19 1928 08/11/19 0415  BP: (!) 111/92 (!) 114/91 123/85 (!) 138/99  Pulse: 88 85 89 85  Resp:  16 (!) 22 18  Temp:   98.6 F (37 C) 99 F (37.2 C)  TempSrc:   Oral Oral  SpO2:   96% 92%  Weight:    (!) 139 kg  Height:       Weight change: -4.445 kg  Intake/Output Summary (Last 24 hours) at 08/11/2019 1045 Last data filed at 08/11/2019 8676 Gross per 24 hour  Intake 940 ml  Output 1925 ml  Net -985 ml   General appearance: Alert, cooperative, appears stated age and in no apparent distress Head: Normocephalic, without obvious abnormality, atraumatic Lungs: Normal work of breathing. CTAB Heart: Regular rate and rhythm, S1, S2 normal, no murmur, click, rub or gallop Abdomen: Soft, non-tender; bowel sounds normal; no masses, no organomegaly. Exam limited by obese habitus Extremities: 2+ BLE pitting edema. BUE wnl Neurologic: Grossly normal Psych: Mood and affect normal   Assessment/Plan:  Principal Problem:   Nephrotic syndrome Active Problems:   Acute congestive heart failure [facility])  Summary: Edward Page is a 45 year old man with a past medical history of hypertension and diabetes who presents to the ED with a two week history of bilateral leg swelling and dyspnea on exertion and a two  day history of scrotal swelling and pain.  #Nephrotic Syndrome Patient still volume up on exam but with continued interval improvement and response to diuretics. Net negative 1.5L over the past day and net negative ~20L cumulative throughout admission. Weight down from 161.8 kg on admission to 139 kg today Nephrotic syndrome labs all negative to date Over the past day has transitioned from furosemide 80 mg bid IV to PO. Creatinine 1.51--> 1.48 this morning with stable electrolytes. Vitals unremarkable. Biopsy yesterday successful with no signs of complication. Surgical pathology results pending. 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 -Appreciate Nephrology recs: C/w lisinopril 20 mg daily, f/u labs, f/u renal bx pathology reports  -C/w furosemide 80 mg PO, continue at discharge. -Monitor electrolytes and replete as needed - If reversible cause of proteinuria not found, can consider ppx anticoagulation per 2012 KDIGO guidelines (serum albumin <2.5, BMI >35; level 2C recommendation). Would most likely need warfarin d/t cost and insurance status. Information to be included in outpatient provider information for follow up in discharge summary -F/u UA with protein/Cr ratio to assess for ongoing proteinuria at time of discharge to guide outpatient follow up  #T2DM HgA1c found to be 12.1%. On metformin at home. CBG 212 this a.m. after starting Lantus 5 units last night. -C/w Lantus 5 units qhs -SSI -Continue atorvastatin, continue at discharge -Plan for discharge on insulin regimen in addition to metformin  #  HTN #PVCs Patient home anti-hypertensive regimen includes metoprolol tartrate 25 mg bid. Pressures well-controlled. PVC burden decreased since starting beta-blocker. -C/w metoprolol tartrate 25 mg bid -C/w cardiac monitoring  Diet: Heart healthy/Carb modified DVT ppx: Lovenox Code: Full  Dispo: Plan for discharge today  pending input from Nephrology   LOS: 7 days   [name], Medical Student 08/11/2019, 10:45 AM

## 2019-08-11 NOTE — Discharge Summary (Signed)
Name: Edward Page MRN: [id number] DOB: 04/07/1976 44 y.o. PCP: Andres Shad, MD  Date of Admission: 08/04/2019  2:25 AM Date of Discharge: 08/11/2019 Attending Physician: Gilles Chiquito MD  Discharge Diagnosis: 1. New-onset Nephrotic Syndrome 2. Heart Failure with Preserved Ejection Fraction 3. Uncontrolled Diabetes Mellitus  Discharge Medications: Allergies as of 08/11/2019   No Known Allergies     Medication List    TAKE these medications   atorvastatin 40 MG tablet Commonly known as: LIPITOR Take 1 tablet (40 mg total) by mouth daily.   furosemide 80 MG tablet Commonly known as: LASIX Take 1 tablet (80 mg total) by mouth 2 (two) times daily. What changed:   medication strength  how much to take  when to take this   insulin NPH-regular Human (70-30) 100 UNIT/ML injection Inject 6 Units into the skin 2 (two) times daily with a meal.   Insulin Syringe-Needle U-100 29G X 1/2" 0.3 ML Misc 1 application by Does not apply route 2 (two) times daily.   lisinopril 20 MG tablet Commonly known as: ZESTRIL Take 1 tablet (20 mg total) by mouth daily.   metFORMIN 500 MG tablet Commonly known as: GLUCOPHAGE Take 500 mg by mouth 2 (two) times daily.   metoprolol tartrate 25 MG tablet Commonly known as: LOPRESSOR Take 25 mg by mouth 2 (two) times daily.       Disposition and follow-up:   [name] was discharged from [facility] in Stable condition.  At the hospital follow up visit please address:  1. New-onset Nephrotic Syndrome - Presented w/ nephrotic syndrome, assess volume status and adjust diuretic regimen as needed - Ensure he f/u with outpatient nephrology - F/u Renal biopsy result and treat appropriately - Consider starting him on Warfarin for chronic anti-coagulation per KDIGO guidelines  2. Heart Failure with Preserved Ejection Fraction - Noted to have grade 2 diastolic dysfunction on echocardiogram - Consider  repeating echocardiogram when euvolemic to more accurately assess cardiac function  3. Uncontrolled Diabetes Mellitus - Noted to have hemoglobin a1c of 12.1 - Discharged with insulin - Adjust diabetic regimen as appropriate for improved glycemic control  2.  Labs / imaging needed at time of follow-up: cbc, bmp  3.  Pending labs/ test needing follow-up: Renal biopsy  Follow-up Appointments: Follow-up Information    [place] [place] Follow up.   Why: We will  call with appt details Contact information: [place] [address] [phone]        Call Andres Shad, MD.   Specialty: Family Medicine Why: May 4 at 2pm per Caffie Damme information: [address] [place]ina [place] [address] [phone]           Hospital Course by Problem List:  1. Nephrotic Syndrome [name] is a 44 year old man with a past medical history of diabetes and hypertension who presented to the [facility] ED with a two day history of progressive scrotal pain and swelling in the context of a two week history of progressive leg swelling and dyspnea on exertion. Upon admission, he was found to have a BNP elevated to 1264, total protein 6.2, and an albumin of 2.4. UA revealed marked proteinuria ~5g/day. The patient was diagnosed with the nephrotic syndrome by virtue of his proteinuria, hypoalbuminemia, and peripheral edema on exam. Also supported by abnormal lipid panel. CXR indicated cardiomegaly and an echo was performed showing EF 50-55%. Considering proteinuria and serum albumin levels, a renal etiology was found to  be the most important contributor to his fluid overload rather than CHF exacerbation. Uncontrolled DM was considered as a cause of nephrotic syndrome given HgA1c 12.1% but the acuity of presentation and microscopic hematuria warranted further investigation.  The patient was seen by Nephrology and a workup for nephrotic syndrome was initiated. Demographically, he is high  risk for FSGS so HIV/HBV/HCV screening were ordered and negative. Kappa/Lambda light chains and protein electrophoresis were not convincing for plasma cell dyscrasia. Serological workup including ANA, ANCA, anti-GBM, Complement levels for MPGN/C3 glomerulopathy were all found to be negative. Nephrology performed renal biopsy prior to discharge with pathology results pending.  Mr. Vera was treated with Lasix and responded with vigorous diuresis with gradual improvement in BLE edema and scrotal pain/swelling. He was also started on Lisinopril for control of proteinuria as well as hypertension. Discharged with appropriate return precautions such as return of swelling and will instructions for close outpatient follow-up.  2. Chronic diastolic heart failure: [name] was found to have grade 2 diastolic dysfunction and low normal EF of 50-55% on echocardiogram. His anasarca was presumed to be due to his nephrotic syndrome and not due to acute heart failure. Discharged with recommendation to f/u with PCP for further assessment.  3. Uncontrolled Diabetes Mellitus [name] was found to have hemoglobin a1c of 12.1 on admission. Controlled with sliding scale insulin during admission with good control. Discharged with recommendation to start insulin therapy with education on insulin administration provided during hospitalization. Advised to f/u with PCP to establish long timer diabetic regimen.  Discharge Vitals:   BP 121/86 (BP Location: Left Arm)   Pulse 81   Temp 98.3 F (36.8 C) (Oral)   Resp 17   Ht 5\' 9"  (1.753 m)   Wt (!) 139 kg Comment: scale b  SpO2 100%   BMI 45.25 kg/m   Pertinent Labs, Studies, and Procedures:  BMP Latest Ref Rng & Units 08/11/2019 08/10/2019 08/09/2019  Glucose 70 - 99 mg/dL 212(H) 204(H) 192(H)  BUN 6 - 20 mg/dL 23(H) 18 13  Creatinine 0.61 - 1.24 mg/dL 1.48(H) 1.51(H) 1.34(H)  Sodium 135 - 145 mmol/L 137 137 138  Potassium 3.5 - 5.1 mmol/L 4.9 4.9 4.4  Chloride 98 -  111 mmol/L 99 97(L) 99  CO2 22 - 32 mmol/L 29 28 31   Calcium 8.9 - 10.3 mg/dL 8.6(L) 8.9 8.6(L)   Echocardiogram IMPRESSIONS  1. Left ventricular ejection fraction, by estimation, is 50 to 55%. The  left ventricle has low normal function. The left ventricle demonstrates  global hypokinesis. The left ventricular internal cavity size was mildly  dilated. There is mild concentric  left ventricular hypertrophy. Left ventricular diastolic parameters are  consistent with Grade II diastolic dysfunction (pseudonormalization).  Elevated left atrial pressure.  2. Right ventricular systolic function is normal. The right ventricular  size is normal. There is moderately elevated pulmonary artery systolic  pressure. The estimated right ventricular systolic pressure is 00.8 mmHg.  3. Left atrial size was mildly dilated.  4. The mitral valve is normal in structure. Trivial mitral valve  regurgitation.  5. The aortic valve is normal in structure. Aortic valve regurgitation is  not visualized.  6. The inferior vena cava is dilated in size with <50% respiratory  variability, suggesting right atrial pressure of 15 mmHg.    Discharge Instructions: Discharge Instructions    Call MD for:  severe uncontrolled pain   Complete by: As directed    Call MD for:  temperature >100.4   Complete by:  As directed    Diet - low sodium heart healthy   Complete by: As directed    Discharge instructions   Complete by: As directed    You came to [place] with swelling in your legs and scrotum and shortness of breath. We have determined this fluid overload was caused by a problem in your kidneys called nephrotic syndrome. Here are our recommendations for you as discharge:  -Please follow up closely with your outpatient provider after discharge -Please take your insulin as prescribed (Relion Insulin NPH 6 units twice daily) -Please start furosemide (Lasix) 80 mg twice daily, lisinopril 20 mg daily, atorvastatin  (Lipitor) 40 mg daily, and metoprolol 20 mg twice daily -Please re-start your metformin 500 mg twice daily -Please return to the hospital if you notice concerning symptoms such as worsening swelling, sudden pain that does not get better, blood in your urine, and shortness of breath.  Thank you for choosing [facility]. We appreciate the opportunity to take care of you. Stay well.   Increase activity slowly   Complete by: As directed       Signed: Mosetta Anis, MD 08/12/2019, 12:59 PM   Pager: [phone]

## 2019-08-11 NOTE — Discharge Instructions (Signed)

## 2019-08-11 NOTE — Progress Notes (Signed)
Occupational Therapy Treatment Patient Details Name: Edward Page MRN: [id number] DOB: 1975/07/16 Today's Date: 08/11/2019    History of present illness Pt is a 44 y/o male admitted secondary to increased LE and scrotal swelling likely from nephrotic syndrome. PMH includes DM and HTN.    OT comments  Pt progressing to OOB ADL in standing at sink. Pt education for AE  (LH sponge and reacher) for LB ADL. Pt reports that his children and spouse will assist as needed for LB ADL due to swelling.  Pt mobilizing well with rollator in room and requires initial standing balance. Pt encouraged to recall instructions for scrotal sling wear/wash/donning/doffing process with cueing to assist with recall. Sign placed in room. Pt/RN reminders of scrotal sling maintenance. Pt would benefit from continued OT for scrotal sling check and to address any other LB ADL tasks. OT following acutely.    Follow Up Recommendations  No OT follow up    Equipment Recommendations  None recommended by OT    Recommendations for Other Services      Precautions / Restrictions Precautions Precautions: Fall Precaution Comments: mod fall, scrotal sling Restrictions Weight Bearing Restrictions: No       Mobility Bed Mobility Overal bed mobility: Independent Bed Mobility: Supine to Sit     Supine to sit: Independent        Transfers Overall transfer level: Independent Equipment used: 4-wheeled walker Transfers: Sit to/from Stand Sit to Stand: Independent         General transfer comment: patient received standing when I entered room after getting extra gown    Balance Overall balance assessment: Modified Independent;Mild deficits observed, not formally tested Sitting-balance support: Feet supported Sitting balance-Leahy Scale: Good     Standing balance support: Bilateral upper extremity supported;During functional activity;No upper extremity supported Standing balance-Leahy Scale: Fair Standing  balance comment: ambulating 20' with RW                           ADL either performed or assessed with clinical judgement   ADL Overall ADL's : Needs assistance/impaired     Grooming: Supervision/safety;Standing               Lower Body Dressing: Moderate assistance Lower Body Dressing Details (indicate cue type and reason): Pt reports that his children and spouse will assist as needed for LB ADL due to swelling             Functional mobility during ADLs: Min guard;Rolling walker General ADL Comments: Pt mobilizing well with rollator in room and requires initial standing balance. Pt encouraged to recall instructions for scrotal sling wear/wash/donning/doffing process with cueing to assist with recall. Sign placed in room. Pt/RN reminders of scrotal sling maintenance.     Vision   Vision Assessment?: No apparent visual deficits   Perception     Praxis      Cognition Arousal/Alertness: Awake/alert Behavior During Therapy: WFL for tasks assessed/performed Overall Cognitive Status: Within Functional Limits for tasks assessed                                          Exercises     Shoulder Instructions       General Comments Scrotal sling donned; appears to be working, swelling has decreased since yesterday. Pt feel supported with sling on.    Pertinent Vitals/ Pain  Pain Assessment: No/denies pain  Home Living                                          Prior Functioning/Environment              Frequency  Min 2X/week        Progress Toward Goals  OT Goals(current goals can now be found in the care plan section)  Progress towards OT goals: Progressing toward goals  Acute Rehab OT Goals Patient Stated Goal: to return home OT Goal Formulation: With patient Time For Goal Achievement: 08/21/19 Potential to Achieve Goals: Good ADL Goals Pt Will Perform Lower Body Bathing: sit to/from  stand;sitting/lateral leans;with modified independence Pt Will Perform Lower Body Dressing: with modified independence;sitting/lateral leans;sit to/from stand Pt Will Transfer to Toilet: with modified independence;regular height toilet;ambulating Additional ADL Goal #1: Pt will recall wearing schedule and benefits of scrotal sling wear for increased BADL participation  Plan Discharge plan remains appropriate    Co-evaluation                 AM-PAC OT "6 Clicks" Daily Activity     Outcome Measure   Help from another person eating meals?: None Help from another person taking care of personal grooming?: None Help from another person toileting, which includes using toliet, bedpan, or urinal?: A Little Help from another person bathing (including washing, rinsing, drying)?: A Little Help from another person to put on and taking off regular upper body clothing?: None Help from another person to put on and taking off regular lower body clothing?: A Little 6 Click Score: 21    End of Session Equipment Utilized During Treatment: Gait belt  OT Visit Diagnosis: Unsteadiness on feet (R26.81);Pain Pain - Right/Left: Right   Activity Tolerance Patient tolerated treatment well   Patient Left in chair;with call bell/phone within reach   Nurse Communication Mobility status        Time: 3875-6433 OT Time Calculation (min): 13 min  Charges: OT General Charges $OT Visit: 1 Visit OT Treatments $Self Care/Home Management : 8-22 mins  Jefferey Pica, OTR/L Acute Rehabilitation Services Pager: [phone] Office: [phone]    [name] 08/11/2019, 1:24 PM

## 2019-08-20 ENCOUNTER — Encounter (HOSPITAL_COMMUNITY): Payer: Self-pay

## 2019-08-20 LAB — SURGICAL PATHOLOGY

## 2019-09-12 ENCOUNTER — Other Ambulatory Visit: Payer: Self-pay | Admitting: Internal Medicine

## 2021-10-29 NOTE — Discharge Summary (Signed)
 [facility] Hospitalists Discharge Summary         Admit date:    10/27/2021 Discharge date:   10/29/2021 Length of stay:    LOS: 2 days     Discharge Service:   Hospitalists Discharge Attending Physician: Aleene Gasmen, MD, Hospitalist Service Discharge to:    To Home Condition at Discharge:  good Code Status:    Full Code  Patient Care Team: Clem Lupita Pence II as PCP - General (Family Medicine)  Consults       none  Discharge Diagnoses  Principal Problem:   Esophagitis Active Problems:   Dehydration   AKI (acute kidney injury) (CMS-HCC)   Gastroenteritis   Herpes labialis   DM (diabetes mellitus) type II controlled with renal manifestation (CMS-HCC)   Benign essential HTN Resolved Problems:   HLD (hyperlipidemia)   Hospital Course   Patient with chronic kidney disease stage III, diabetes mellitus and morbid obesity presented with nausea, vomiting and diarrhea.  Patient was grossly dehydrated.  Leukocytes were 22,000 however this is likely reactive.  Has significant inflammation in the distal esophagus and treated with PPI for acute esophagitis.  UA was negative for pyuria.  CT abdomen suggested possible perinephric stranding however patient clinically does not have a UTI.  There was no abdominal tenderness.  He was afebrile.   Patient on admission with improvement with symptomatic treatment and hydration.  He is tolerating diet.  If symptoms are normalized.  He remained afebrile.  Renal function is back to baseline.  Patient discharged with PPI for esophagitis.  Patient will need EGD and colonoscopy.  Has not had a colonoscopy performed.  Counseled to get PCP to refer. Discharge plan discussed with patient.  He verbalizes understanding.  Vital signs are stable.  Patient discharged in stable condition.  Vitals:   10/29/21 0741  BP: 139/75  Pulse: 78  Resp: 18  Temp: 36.8 C (98.3 F)  SpO2: 98%    GEN: NAD, lying in bed CV: RRR, no murmurs  appreciated PULM: clear ABD: soft, non tender NEURO: No focal deficits PSYCH: A+Ox3, appropriate Extremity: No edema, no contractures  I spent greater than 30 mins in the discharge of this patient.  Procedures   none  Discharge Medications     Your Medication List    STOP taking these medications   furosemide  80 MG tablet Commonly known as: LASIX    metFORMIN 500 MG tablet Commonly known as: GLUCOPHAGE     START taking these medications   ondansetron  4 MG tablet Commonly known as: ZOFRAN  Take 1 tablet (4 mg total) by mouth every eight (8) hours as needed for nausea for up to 7 days.   pantoprazole 40 MG tablet Commonly known as: PROTONIX Take 1 tablet (40 mg total) by mouth daily.   valACYclovir 500 MG tablet Commonly known as: VALTREX Take 1 tablet (500 mg total) by mouth Two (2) times a day.     CONTINUE taking these medications   atorvastatin  40 MG tablet Commonly known as: LIPITOR Take 1 tablet (40 mg total) by mouth daily.   chlorthalidone 25 MG tablet Commonly known as: HYGROTON Take 1 tablet (25 mg total) by mouth every morning.   FARXIGA 10 mg Tab tablet Generic drug: dapagliflozin propanediol Take 1 tablet (10 mg total) by mouth daily.   KERENDIA 10 mg Tab Generic drug: finerenone Take 1 tablet by mouth daily.   lisinopriL  40 MG tablet Commonly known as: PRINIVIL ,ZESTRIL  Take 1 tablet (40 mg total) by mouth  daily.   metoprolol  tartrate 25 MG tablet Commonly known as: LOPRESSOR  Take 1 tablet (25 mg total) by mouth Two (2) times a day.   NovoLIN 70/30 U-100 Insulin  100 unit/mL (70-30) injection Generic drug: insulin  NPH-insulin  regular (70/30) Inject 0.06 mL (6 Units total) under the skin Two (2) times a day.       Pending Test Results   none   Lab Results    BLOOD Recent Labs  Lab Units 10/29/21 0827 10/28/21 0459 10/27/21 0542  WBC 10*9/L 8.1 11.6* 22.7*  HEMOGLOBIN g/dL 88.7* 89.0* 88.5*  HEMATOCRIT % 34.5* 33.2* 35.4*   PLATELET COUNT (1) 10*9/L 180 177 179   Recent Labs  Lab Units 10/29/21 0827 10/28/21 0459 10/27/21 1753 10/27/21 0542  SODIUM mmol/L 135 136 134* 133*  POTASSIUM mmol/L 3.9 3.8 4.1 4.4  CHLORIDE mmol/L 103 105 100 98  CO2 mmol/L 20.6* 20.5* 21.2 19.1*  BUN mg/dL 47* 52* 54* 51*  CREATININE mg/dL 7.85* 7.68* 7.55* 7.52*  GLUCOSE mg/dL 866 836 798* 761*  CALCIUM  mg/dL 8.5 8.6 8.9 8.7  ALBUMIN g/dL  --   --   --  2.3*  PROTEIN TOTAL g/dL  --   --   --  7.6  BILIRUBIN TOTAL mg/dL  --   --   --  0.6  AST U/L  --   --   --  13*  ALT U/L  --   --   --  24  ALK PHOS U/L  --   --   --  88  LIPASE U/L  --   --   --  52   No results in the last week No results in the last week No results in the last week  No results in the last week  URINE Recent Labs  Lab Units 10/27/21 0612  WBC UA /HPF 3  NITRITE UA  Negative  LEUKOCYTES UA  Negative  BACTERIA UA /HPF Small  RBC UA /HPF 6*  BLOOD UA  Large*  GLUCOSE UA  >1000 mg/dL*  PROTEIN UA  >/= 699 mg/dL*  KETONES UA  15 mg/dL*   No results in the last week  BODY FLUIDS No results in the last week  ABG Recent Labs    10/27/21 1008  PHART 7.34*  PCO2ART 35  PO2ART 148*  HCO3ART 19.2  O2SATART 98.1*  BEART -5.9*    Microbiology Results (last day)    ** No results found for the last 24 hours. **      Imaging   ECG 12 Lead  Result Date: 10/27/2021 Normal sinus rhythm PR and QTc grossly appropriate, rate appropriate, no acute ischemic changes Confirmed by Falcone, Justine ([id number]) on 10/27/2021 6:18:05 AM  CT Abdomen Pelvis Wo Contrast  Result Date: 10/27/2021 CLINICAL DATA:  Epigastric pain. Nausea vomiting and diarrhea. Patient also reports cough and congestion. EXAM: CT ABDOMEN AND PELVIS WITHOUT CONTRAST TECHNIQUE: Multidetector CT imaging of the abdomen and pelvis was performed following the standard protocol without IV contrast. RADIATION DOSE REDUCTION: This exam was performed according to the departmental  dose-optimization program which includes automated exposure control, adjustment of the mA and/or kV according to patient size and/or use of iterative reconstruction technique. COMPARISON:  None Available. FINDINGS: Lower chest: Calcified granuloma identified within the right middle lobe. No pleural effusion or edema. Hepatobiliary: No focal liver abnormality is seen. No gallstones, gallbladder wall thickening, or biliary dilatation. Pancreas: Unremarkable. No pancreatic ductal dilatation or surrounding inflammatory changes. Spleen: Normal in size without  focal abnormality. Adrenals/Urinary Tract: Normal adrenal glands. There is right-sided perinephric fat stranding. No nephrolithiasis, hydronephrosis or mass identified no right-sided hydroureter or ureteral lithiasis. 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 No signs of hydronephrosis or mass. Urinary bladder appears normal. Stomach/Bowel: Mild circumferential wall thickening of the distal esophagus. Stomach appears distended with fluid. Evaluation of bowel pathology is limited due to lack of IV and oral contrast material. The appendix is visualized and appears normal. No bowel wall thickening, inflammation or distension. Vascular/Lymphatic: There is no free fluid scratch aortic atherosclerosis. No aneurysm. No signs of abdominopelvic adenopathy. Reproductive: Prostate is unremarkable. Other: No free fluid or fluid collections. There are several small fat containing periumbilical hernias. No signs of pneumoperitoneum. Musculoskeletal: No acute or suspicious osseous findings. Signs of bilateral sacroiliitis. Degenerative disc disease noted at L5-S1.   1. Right-sided perinephric fat stranding without nephrolithiasis or hydronephrosis. Findings are nonspecific and may be related to pyelonephritis. Correlation with urinalysis suggested. 2. Left pelvic kidney. 3. Mild circumferential wall thickening of the distal esophagus with fluid distended stomach. Correlate for any  clinical signs or symptoms of esophagitis. 4. Signs of bilateral sacroiliitis. 5. Degenerative disc disease at L5-S1. 6. Aortic Atherosclerosis (ICD10-I70.0). Electronically Signed   By: Waddell Calk M.D.   On: 10/27/2021 07:21   XR Chest 2 views  Result Date: 10/27/2021 CLINICAL DATA:  Cough. EXAM: CHEST - 2 VIEW COMPARISON:  08/04/19. FINDINGS: Lung volumes are low. Dense nodule within the right lower lung measures 7 mm and is unchanged from previous exam this is favored to represent a benign granuloma. Calcified hilar lymph nodes are noted. No signs of pleural effusion, airspace consolidation or atelectasis. Spondylosis noted within the thoracic spine.   1. No active cardiopulmonary abnormalities. 2. Signs of prior granulomatous disease. Electronically Signed   By: Waddell Calk M.D.   On: 10/27/2021 06:28    Discharge Instructions   Diet Instructions    Discharge diet (specify)     Discharge Nutrition Therapy: Consistent Carb   Consistent Carb Level: Consistent Carb 60/60/60 (4/4/4)     Activity Instructions    Activity as tolerated          No future appointments.

## 2022-01-05 IMAGING — DX DG CHEST 2V
2 series · 2 of 2 positions shown · non-contrast
Comparison: None.

CLINICAL DATA: Dyspnea, leg swelling.

EXAM:
CHEST - 2 VIEW

[chest lat]
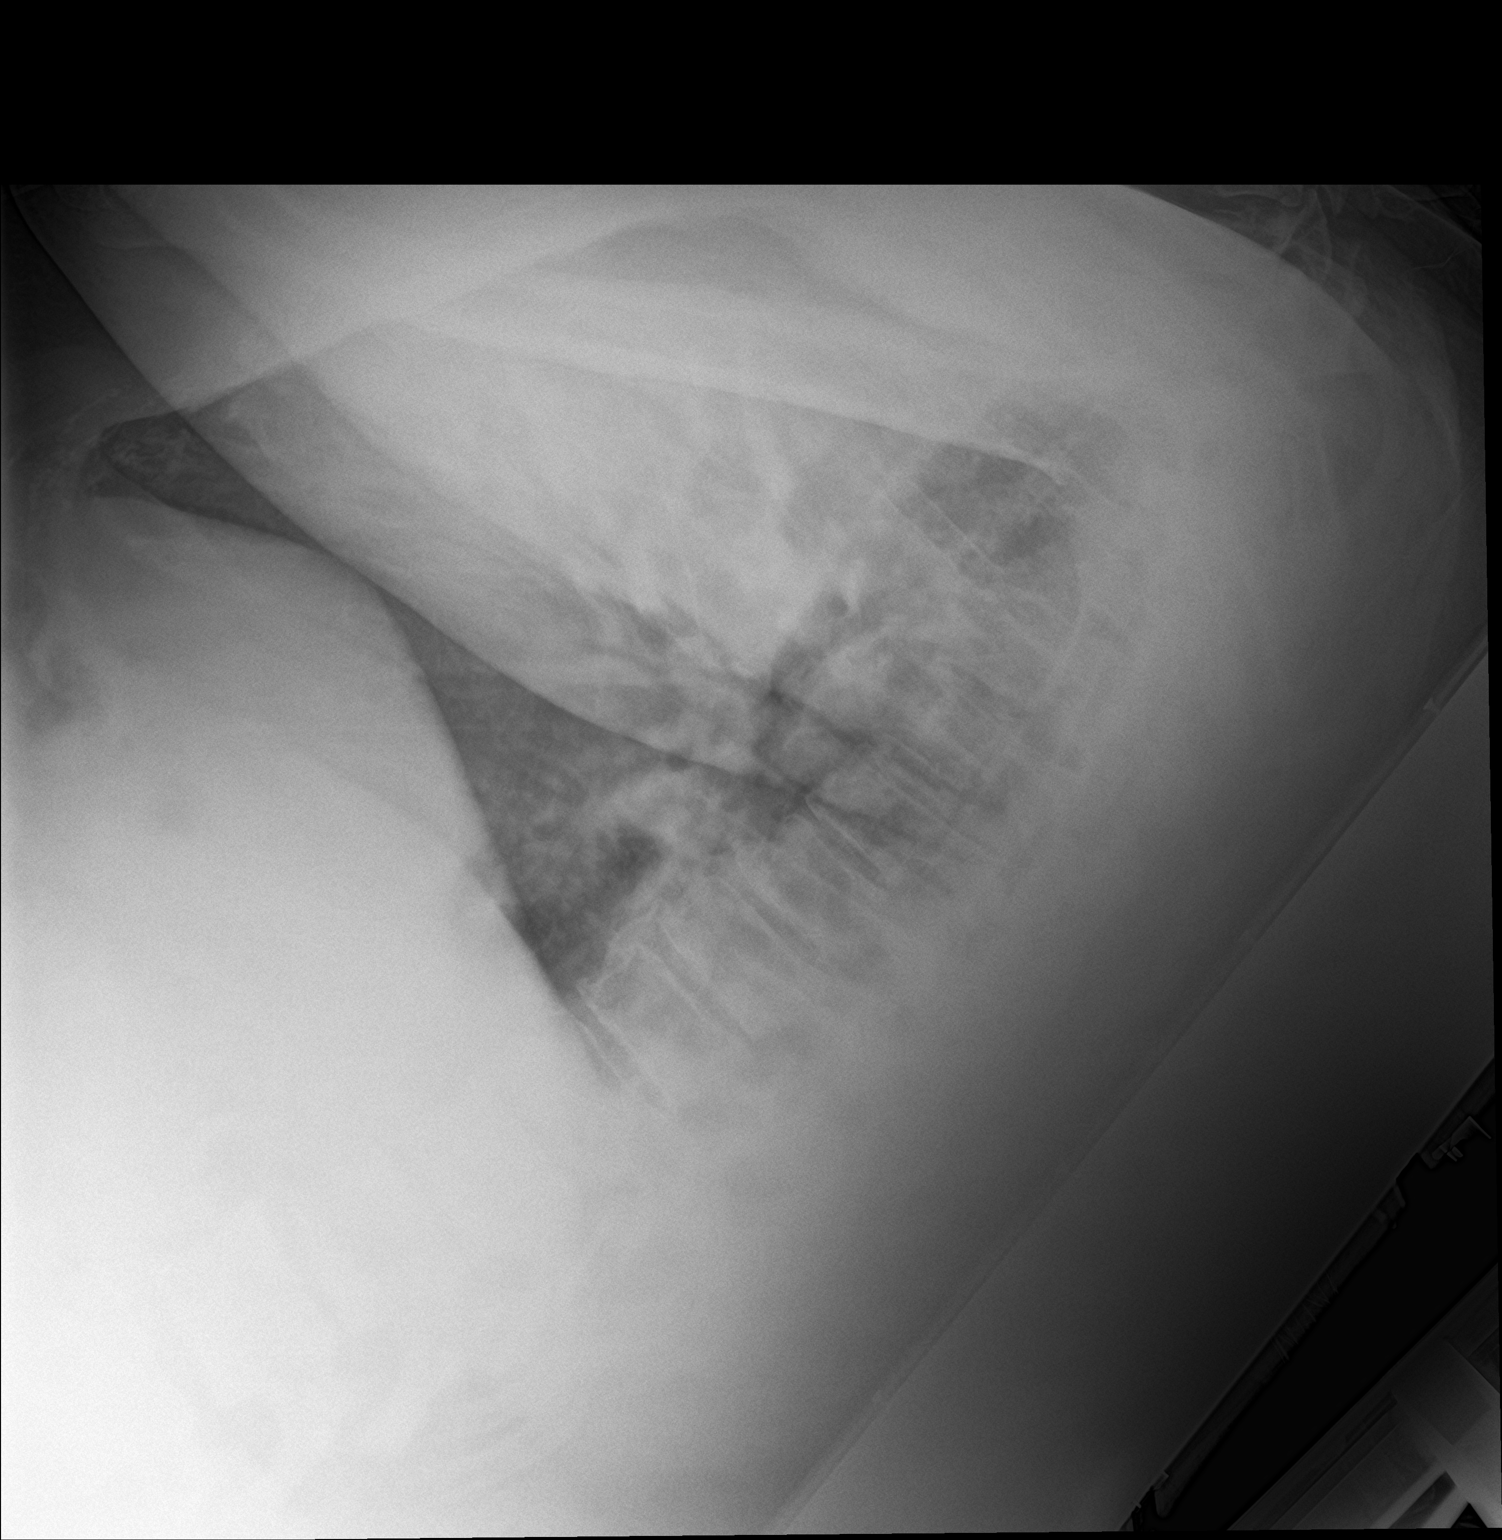

[chest ap]
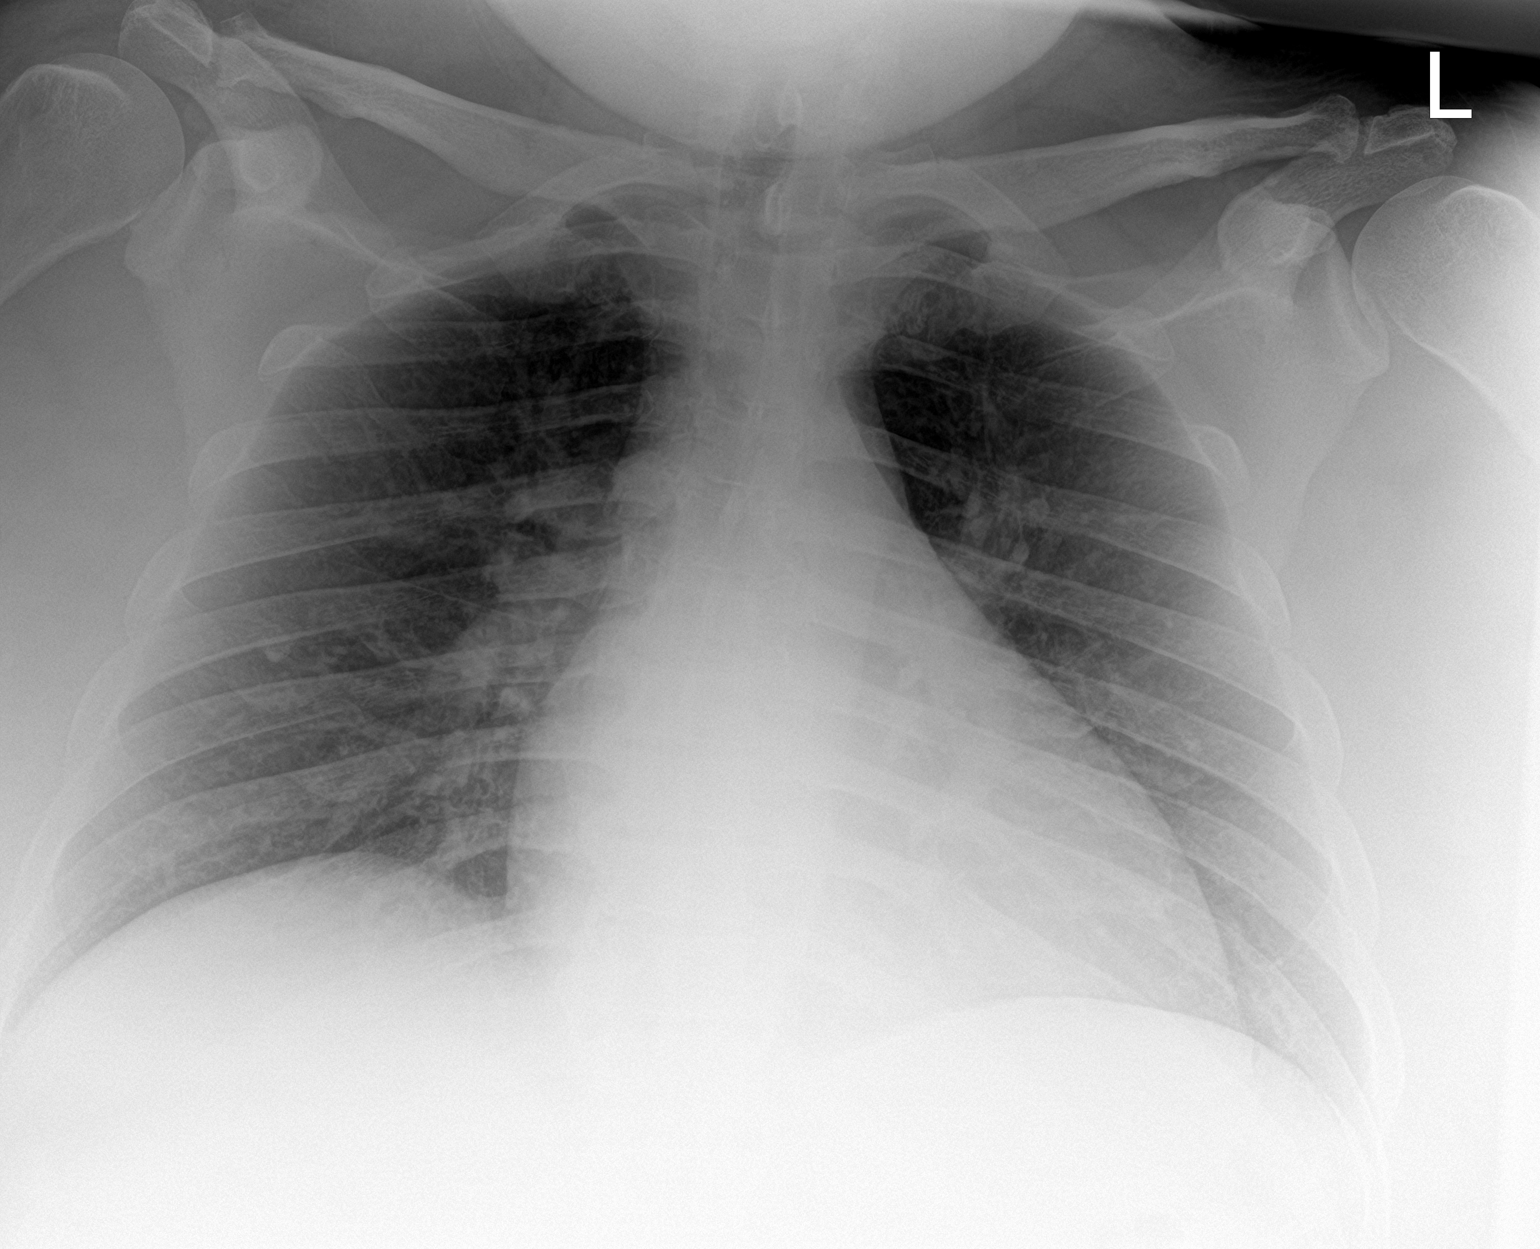

[2 of 2 positions shown; findings below may reference images not displayed]

FINDINGS: Patchy bibasilar opacities.

No pneumothorax or pleural effusion.

Enlarged cardiac silhouette.  Prominent perihilar vessels.

No acute osseous abnormality.
IMPRESSION: Bibasilar opacities may reflect atelectasis versus edema.

Cardiomegaly with prominent perihilar vessels.

## 2022-01-06 IMAGING — US US RENAL
1 series · 14 of 25 positions shown · non-contrast
Comparison: None.

CLINICAL DATA: Nephrotic syndrome

EXAM:
RENAL / URINARY TRACT ULTRASOUND COMPLETE

[Series 1: us renal · 14 of 25 slices shown]
[im 1/25]
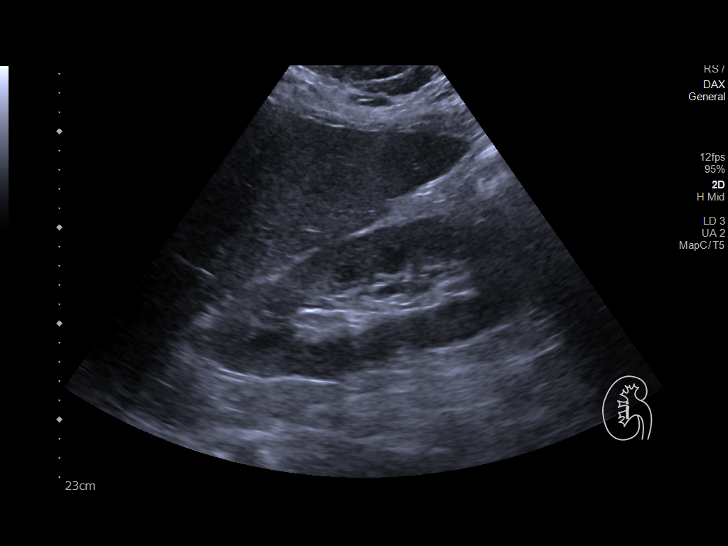
[im 3/25]
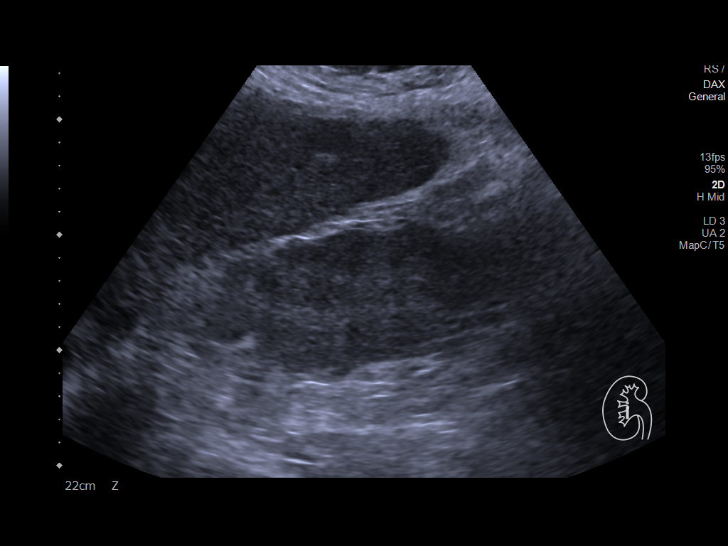
[im 5/25]
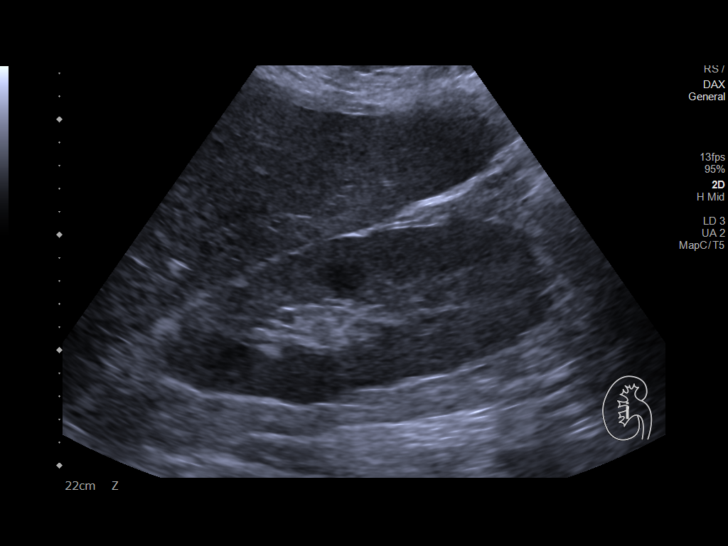
[im 7/25]
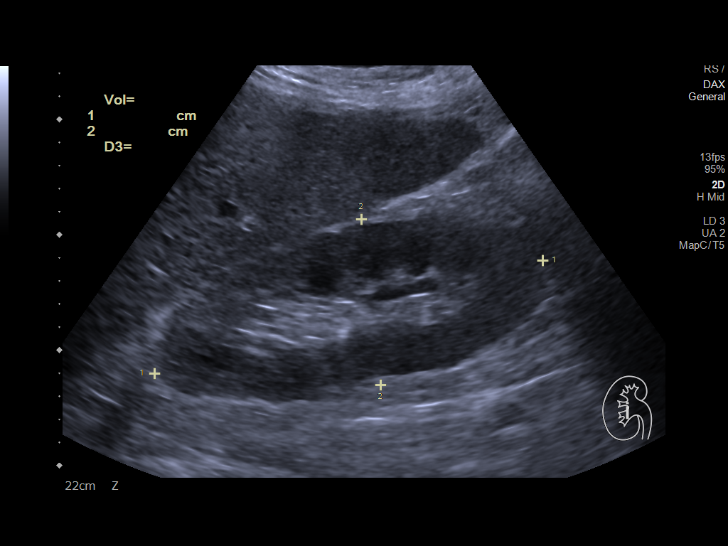
[im 9/25]
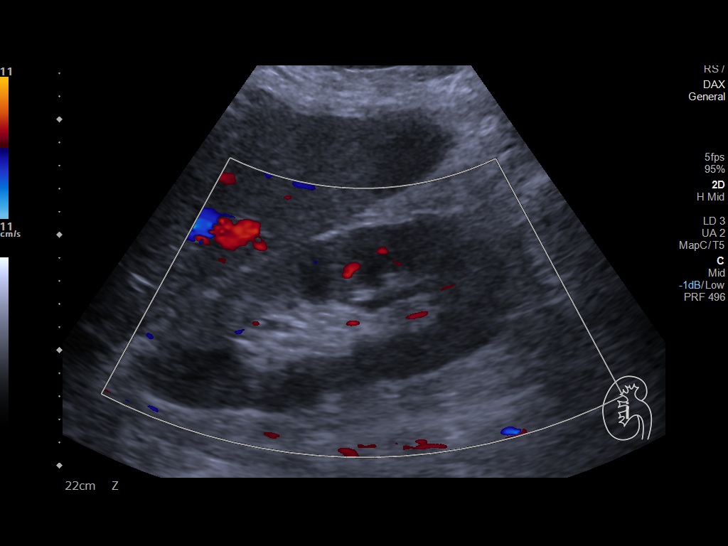
[im 10/25]
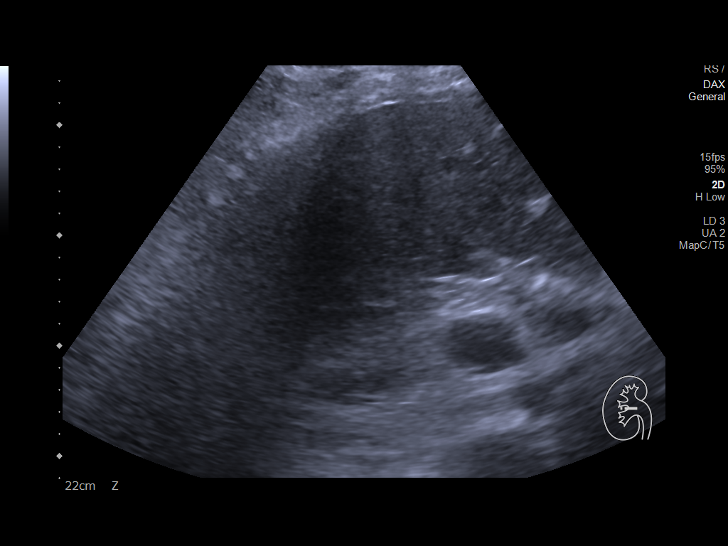
[im 12/25]
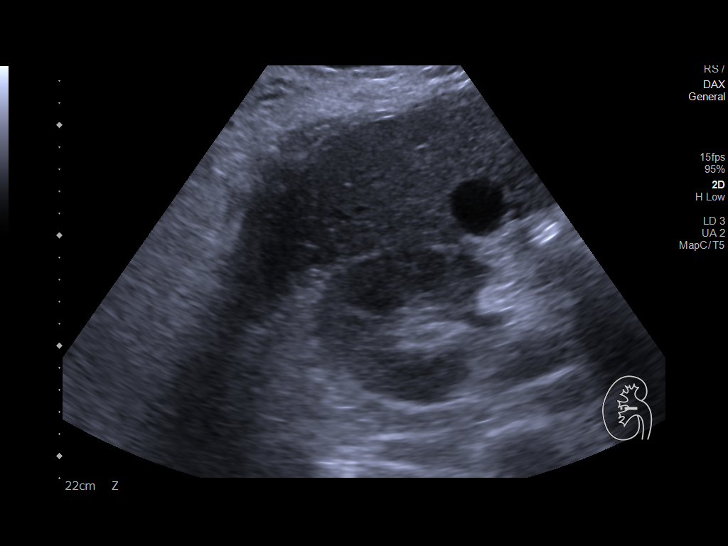
[im 14/25]
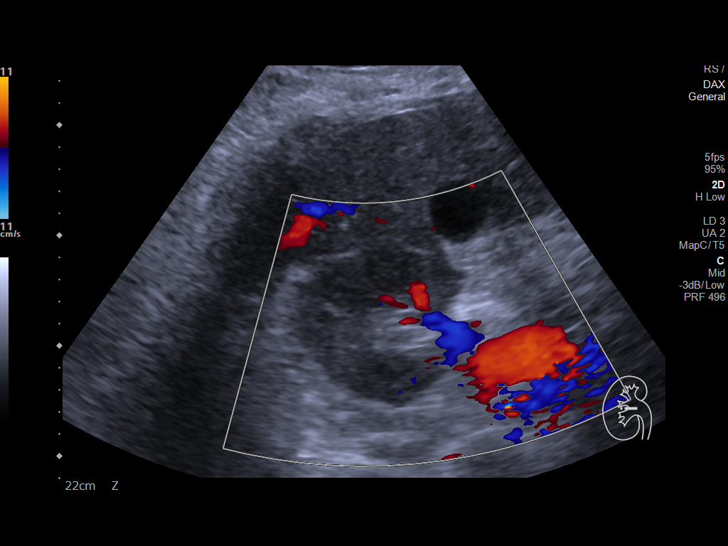
[im 16/25]
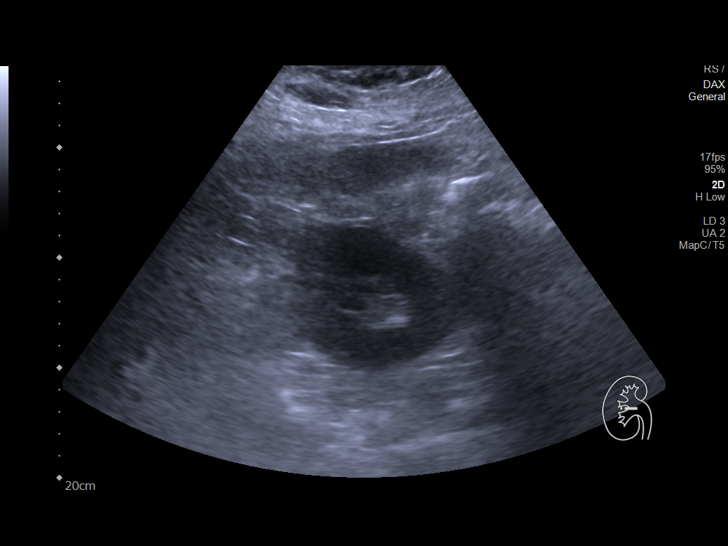
[im 17/25]
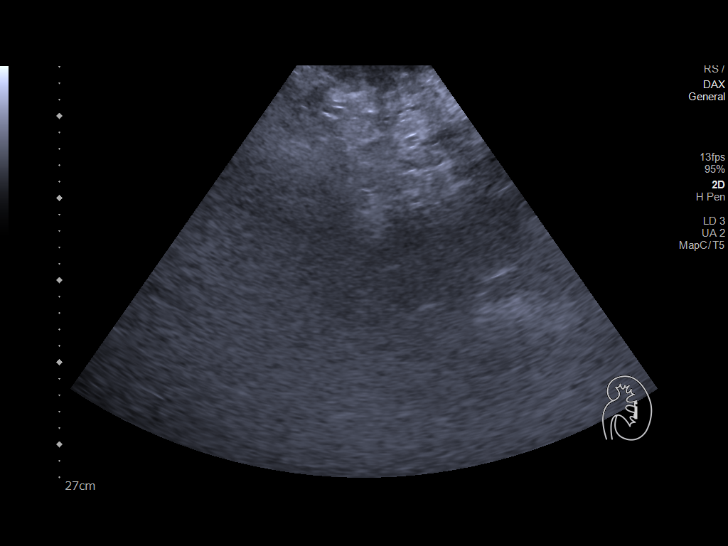
[im 19/25]
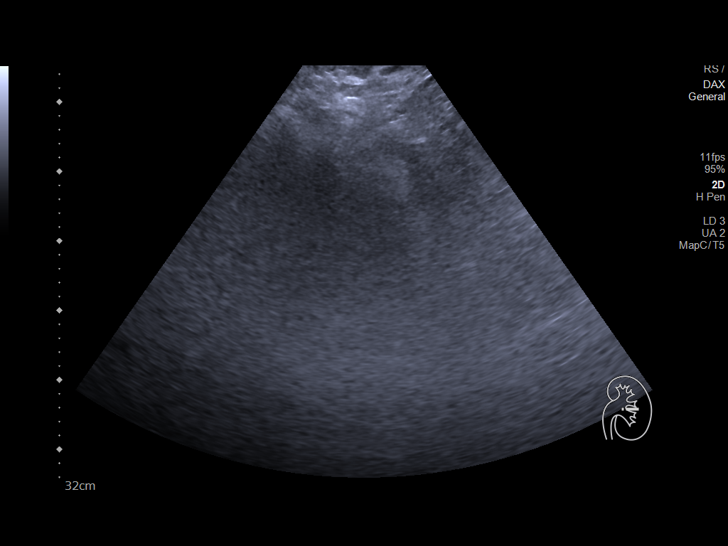
[im 21/25]
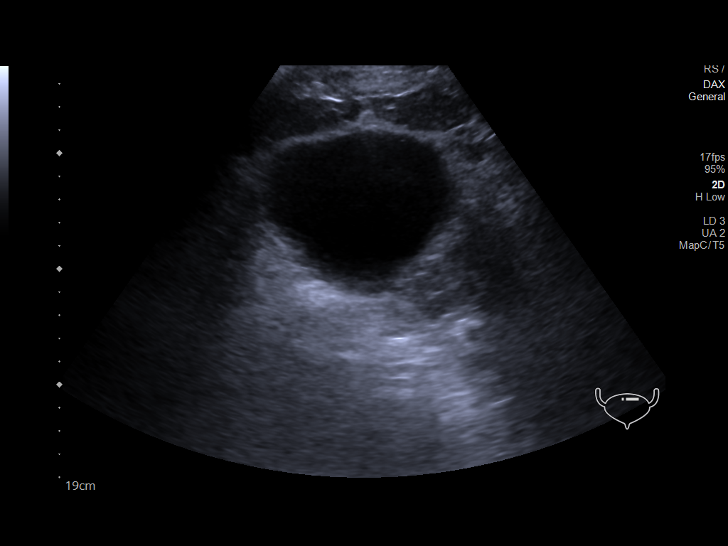
[im 23/25]
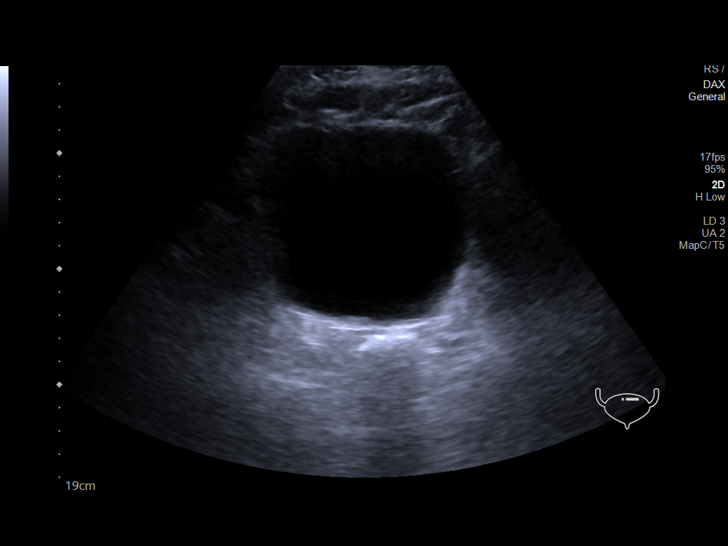
[im 25/25]
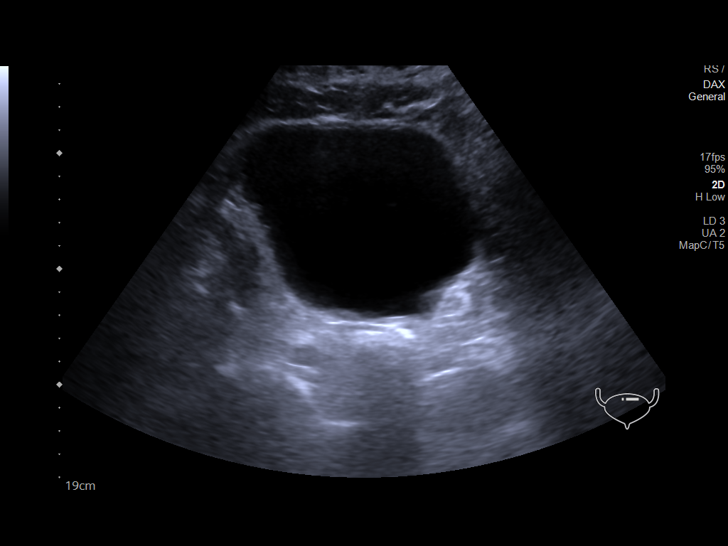

[14 of 25 positions shown; findings below may reference images not displayed]

FINDINGS: Right Kidney:

Renal measurements: 70.5 x 7.2 x 7.4 cm = volume: 490.8 mL. Normal
cortical echogenicity and corticomedullary differentiation. Slight
pelviectasis without calyceal blunting or dilatation to suggest
frank hydronephrosis. No visible obstructing calculus or worrisome
renal mass.

Left Kidney:

Nonvisualization of the left kidney, in part due to patient body
habitus.

Bladder:

Appears normal for degree of bladder distention.

Other:

Suboptimal imaging quality due to patient body habitus.
IMPRESSION: Nonvisualization of the left kidney.

Mild right pelviectasis without frank hydronephrosis.

## 2022-01-11 IMAGING — US US BIOPSY
1 series · 13 of 14 positions shown · non-contrast
Comparison: Renal ultrasound-08/05/2019

INDICATION: Naproxen drum of uncertain etiology. Please perform
ultrasound-guided liver biopsy for tissue diagnostic purposes.

EXAM:
ULTRASOUND GUIDED RENAL BIOPSY

[Series 1: us biopsy (kidney) · 13 of 14 slices shown]
[im 1/14]
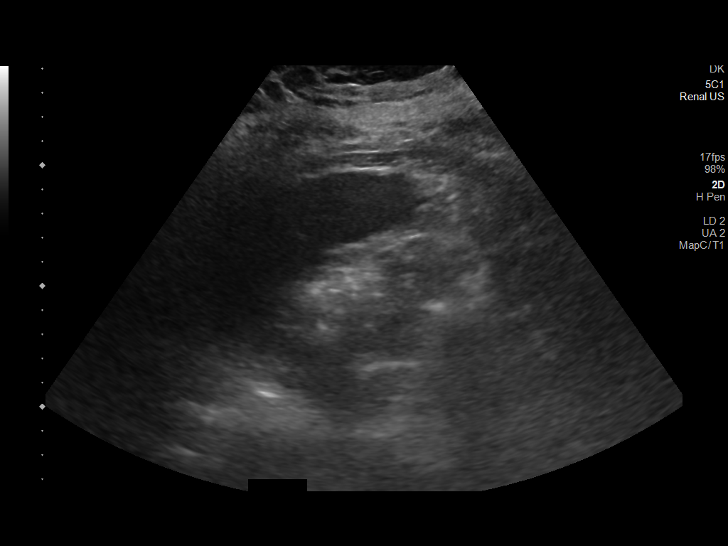
[im 2/14]
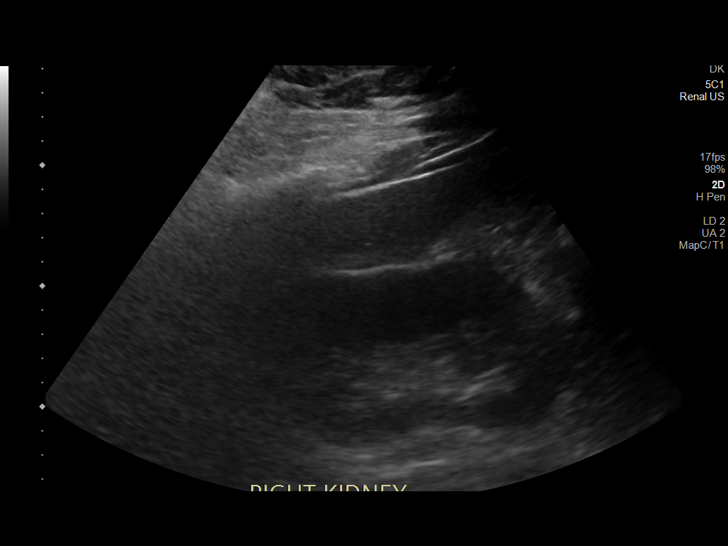
[im 3/14]
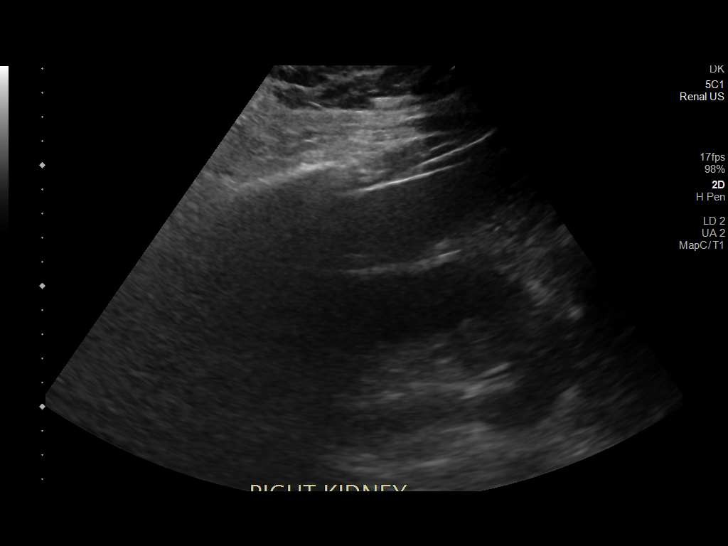
[im 4/14]
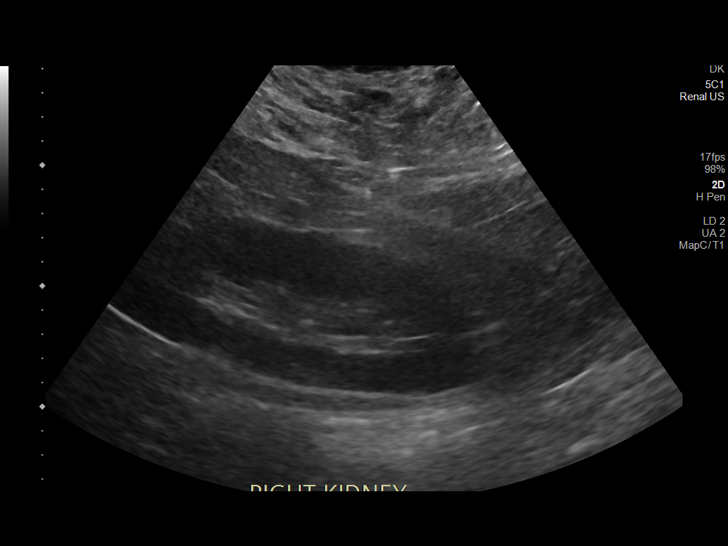
[im 5/14]
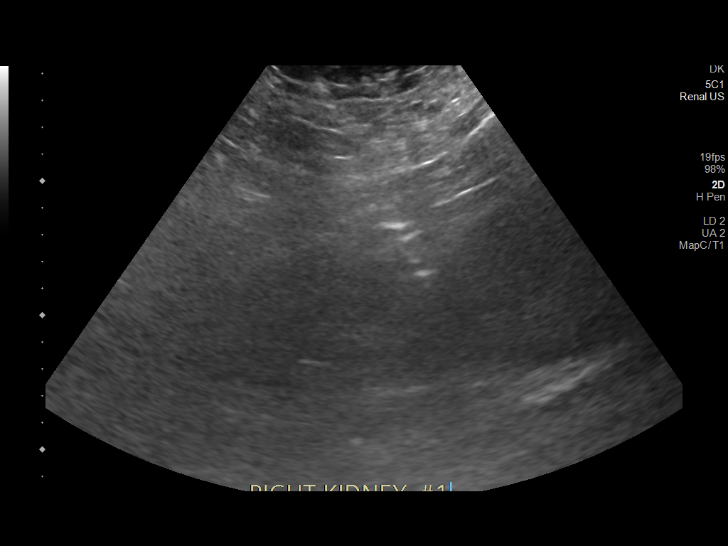
[im 6/14]
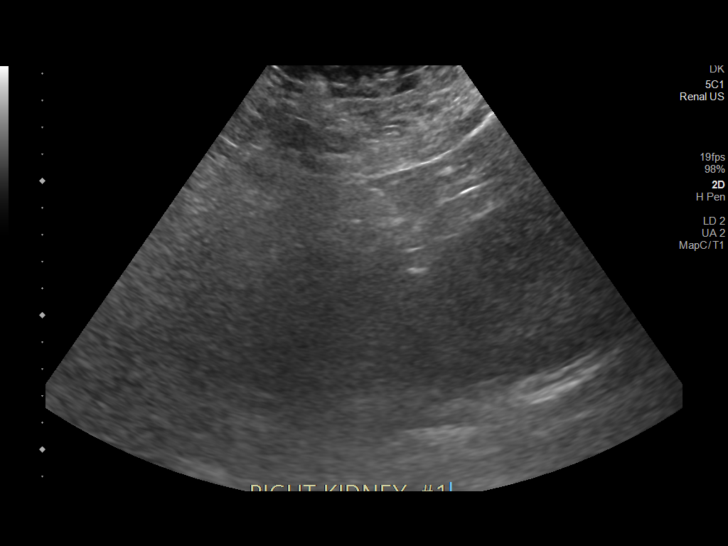
[im 8/14]
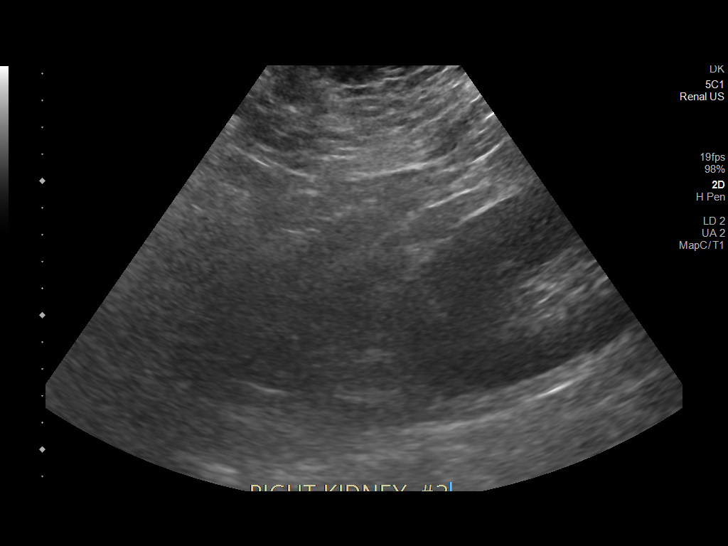
[im 9/14]
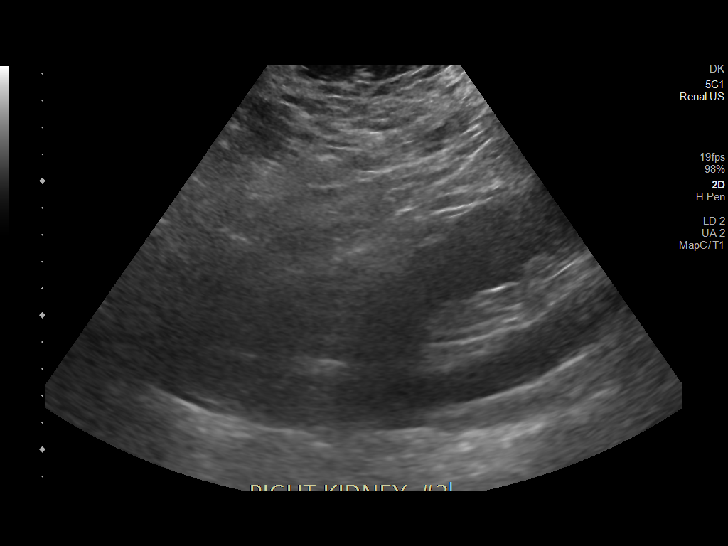
[im 10/14]
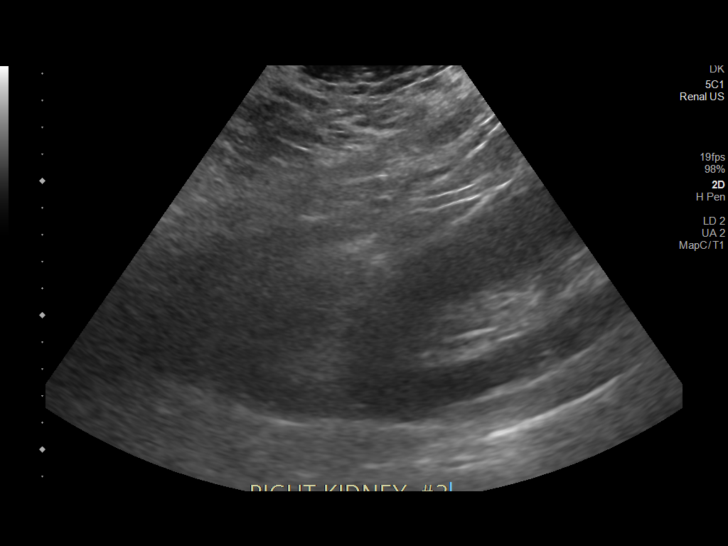
[im 11/14]
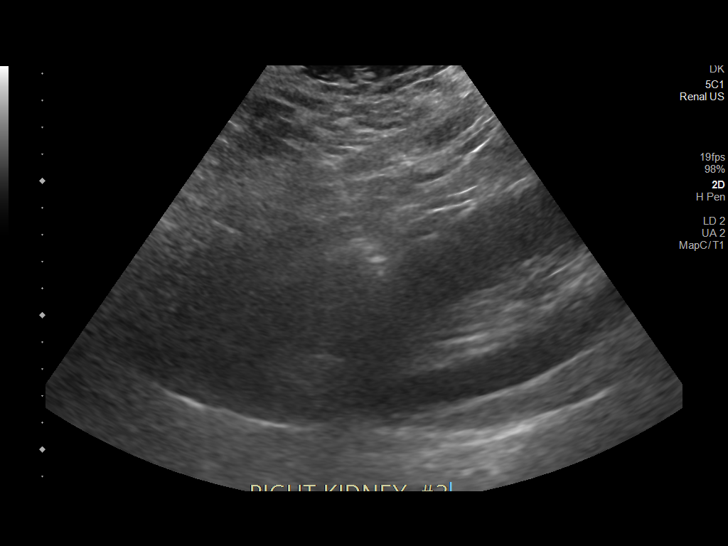
[im 12/14]
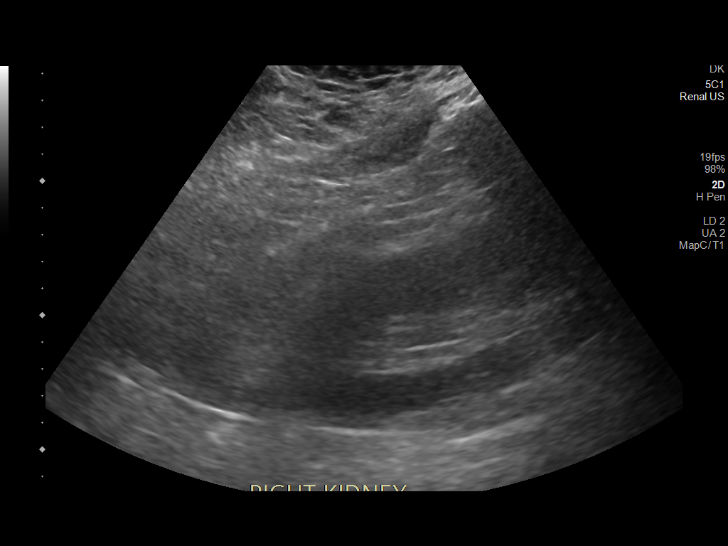
[im 13/14]
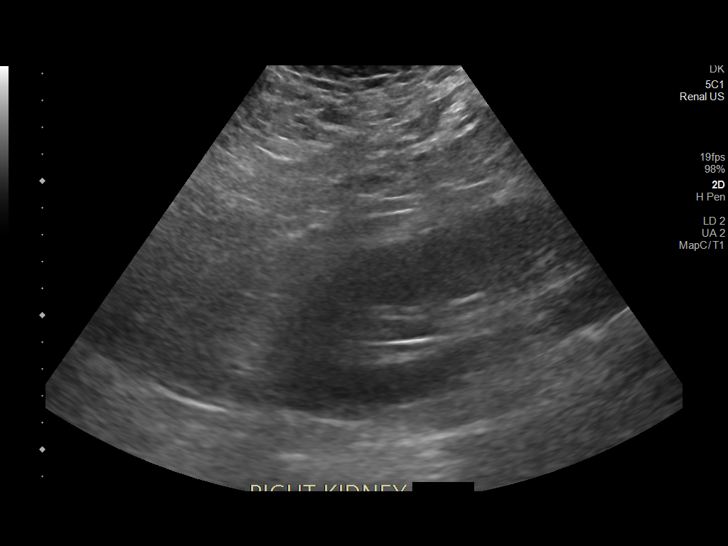
[im 14/14]
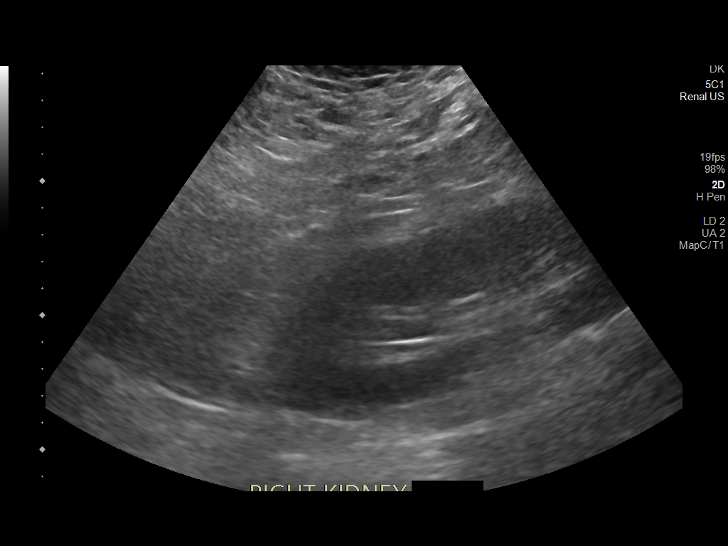

[13 of 14 positions shown; findings below may reference images not displayed]

MEDICATIONS:
None.

ANESTHESIA/SEDATION:
Fentanyl 100 mcg IV; Versed 2 mg IV

Total Moderate Sedation time: 11 minutes; The patient was
continuously monitored during the procedure by the interventional
radiology nurse under my direct supervision.

COMPLICATIONS:
None immediate.

PROCEDURE:
Informed written consent was obtained from the patient after a
discussion of the risks, benefits and alternatives to treatment. The
patient understands and consents the procedure. A timeout was
performed prior to the initiation of the procedure.

Ultrasound scanning was performed of the bilateral flanks., similar
to preceding renal ultrasound, the left kidney was again not
identified sonographically.

As such, the inferior pole of the right kidney was selected for
biopsy due to location and sonographic window. The procedure was
planned. The operative site was prepped and draped in the usual
sterile fashion. The overlying soft tissues were anesthetized with
1% lidocaine with epinephrine. A 17 gauge core needle biopsy device
was advanced into the inferior cortex of the left kidney and 2 core
biopsies were obtained under direct ultrasound guidance. Images were
saved for documentation purposes. The biopsy device was removed and
hemostasis was obtained with manual compression. Post procedural
scanning was negative for significant post procedural hemorrhage or
additional complication. A dressing was placed. The patient
tolerated the procedure well without immediate post procedural
complication.
IMPRESSION: 1. Technically successful ultrasound guided right renal biopsy.
2. Nonvisualization of the left kidney, potentially secondary to
body habitus though could be seen in the setting of congenital
absence. Further evaluation with noncontrast CT scan the abdomen
pelvis could be performed as indicated.

## 2024-04-08 ENCOUNTER — Inpatient Hospital Stay (HOSPITAL_COMMUNITY)
Admission: EM | Admit: 2024-04-08 | Discharge: 2024-04-13 | DRG: 291 | Disposition: A | Discharge status: Home / Self Care | Attending: Internal Medicine | Admitting: Internal Medicine

## 2024-04-08 ENCOUNTER — Emergency Department (HOSPITAL_COMMUNITY)

## 2024-04-08 ENCOUNTER — Encounter (HOSPITAL_COMMUNITY): Payer: Self-pay | Admitting: *Deleted

## 2024-04-08 ENCOUNTER — Other Ambulatory Visit: Payer: Self-pay

## 2024-04-08 ENCOUNTER — Inpatient Hospital Stay (HOSPITAL_COMMUNITY)

## 2024-04-08 DIAGNOSIS — I10 Essential (primary) hypertension: Secondary | ICD-10-CM

## 2024-04-08 DIAGNOSIS — I5021 Acute systolic (congestive) heart failure: Principal | ICD-10-CM

## 2024-04-08 DIAGNOSIS — I160 Hypertensive urgency: Secondary | ICD-10-CM | POA: Diagnosis present

## 2024-04-08 DIAGNOSIS — N179 Acute kidney failure, unspecified: Secondary | ICD-10-CM | POA: Diagnosis present

## 2024-04-08 DIAGNOSIS — Z79899 Other long term (current) drug therapy: Secondary | ICD-10-CM

## 2024-04-08 DIAGNOSIS — E1122 Type 2 diabetes mellitus with diabetic chronic kidney disease: Secondary | ICD-10-CM | POA: Diagnosis present

## 2024-04-08 DIAGNOSIS — I5023 Acute on chronic systolic (congestive) heart failure: Secondary | ICD-10-CM | POA: Diagnosis not present

## 2024-04-08 DIAGNOSIS — Z794 Long term (current) use of insulin: Secondary | ICD-10-CM | POA: Diagnosis not present

## 2024-04-08 DIAGNOSIS — Z1152 Encounter for screening for COVID-19: Secondary | ICD-10-CM | POA: Diagnosis not present

## 2024-04-08 DIAGNOSIS — E66813 Obesity, class 3: Secondary | ICD-10-CM | POA: Diagnosis present

## 2024-04-08 DIAGNOSIS — I5031 Acute diastolic (congestive) heart failure: Secondary | ICD-10-CM

## 2024-04-08 DIAGNOSIS — Z7984 Long term (current) use of oral hypoglycemic drugs: Secondary | ICD-10-CM | POA: Diagnosis not present

## 2024-04-08 DIAGNOSIS — E1165 Type 2 diabetes mellitus with hyperglycemia: Secondary | ICD-10-CM | POA: Diagnosis present

## 2024-04-08 DIAGNOSIS — I5033 Acute on chronic diastolic (congestive) heart failure: Secondary | ICD-10-CM | POA: Diagnosis present

## 2024-04-08 DIAGNOSIS — I130 Hypertensive heart and chronic kidney disease with heart failure and stage 1 through stage 4 chronic kidney disease, or unspecified chronic kidney disease: Secondary | ICD-10-CM | POA: Diagnosis present

## 2024-04-08 DIAGNOSIS — Z6841 Body Mass Index (BMI) 40.0 and over, adult: Secondary | ICD-10-CM | POA: Diagnosis not present

## 2024-04-08 DIAGNOSIS — N184 Chronic kidney disease, stage 4 (severe): Secondary | ICD-10-CM | POA: Diagnosis present

## 2024-04-08 DIAGNOSIS — R0602 Shortness of breath: Secondary | ICD-10-CM | POA: Diagnosis present

## 2024-04-08 LAB — ECHOCARDIOGRAM COMPLETE
Area-P 1/2: 4.36 cm2
Height: 69 in
S' Lateral: 4.1 cm
Single Plane A4C EF: 45.4 %
Weight: 5600 [oz_av]

## 2024-04-08 LAB — CBC
HCT: 30.2 % — ABNORMAL LOW (ref 39.0–52.0)
Hemoglobin: 9.4 g/dL — ABNORMAL LOW (ref 13.0–17.0)
MCH: 26.1 pg (ref 26.0–34.0)
MCHC: 31.1 g/dL (ref 30.0–36.0)
MCV: 83.9 fL (ref 80.0–100.0)
Platelets: 224 K/uL (ref 150–400)
RBC: 3.6 MIL/uL — ABNORMAL LOW (ref 4.22–5.81)
RDW: 14.5 % (ref 11.5–15.5)
WBC: 8.3 K/uL (ref 4.0–10.5)
nRBC: 0 % (ref 0.0–0.2)

## 2024-04-08 LAB — RESP PANEL BY RT-PCR (RSV, FLU A&B, COVID)  RVPGX2
Influenza A by PCR: NEGATIVE
Influenza B by PCR: NEGATIVE
Resp Syncytial Virus by PCR: NEGATIVE
SARS Coronavirus 2 by RT PCR: NEGATIVE

## 2024-04-08 LAB — HEMOGLOBIN A1C
Hgb A1c MFr Bld: 7 % — ABNORMAL HIGH (ref 4.8–5.6)
Mean Plasma Glucose: 154.2 mg/dL

## 2024-04-08 LAB — BASIC METABOLIC PANEL WITH GFR
Anion gap: 13 (ref 5–15)
BUN: 62 mg/dL — ABNORMAL HIGH (ref 6–20)
CO2: 17 mmol/L — ABNORMAL LOW (ref 22–32)
Calcium: 8.4 mg/dL — ABNORMAL LOW (ref 8.9–10.3)
Chloride: 113 mmol/L — ABNORMAL HIGH (ref 98–111)
Creatinine, Ser: 2.7 mg/dL — ABNORMAL HIGH (ref 0.61–1.24)
GFR, Estimated: 28 mL/min — ABNORMAL LOW (ref >60)
Glucose, Bld: 208 mg/dL — ABNORMAL HIGH (ref 70–99)
Potassium: 4.4 mmol/L (ref 3.5–5.1)
Sodium: 143 mmol/L (ref 135–145)

## 2024-04-08 LAB — GLUCOSE, CAPILLARY
Glucose-Capillary: 157 mg/dL — ABNORMAL HIGH (ref 70–99)
Glucose-Capillary: 156 mg/dL — ABNORMAL HIGH (ref 70–99)
Glucose-Capillary: 158 mg/dL — ABNORMAL HIGH (ref 70–99)

## 2024-04-08 LAB — HIV ANTIBODY (ROUTINE TESTING W REFLEX): HIV Screen 4th Generation wRfx: NONREACTIVE

## 2024-04-08 LAB — PRO BRAIN NATRIURETIC PEPTIDE: Pro Brain Natriuretic Peptide: 4315 pg/mL — ABNORMAL HIGH (ref <300.0)

## 2024-04-08 MED ORDER — INSULIN ASPART 100 UNIT/ML IJ SOLN
0.0000 [IU] | Freq: Every day | INTRAMUSCULAR | Status: DC
  Administered 2024-04-08: 22:00:00 2 [IU] via SUBCUTANEOUS
  Filled 2024-04-08 (×2): qty 1

## 2024-04-08 MED ORDER — SODIUM CHLORIDE 0.9% FLUSH
3.0000 mL | Freq: Two times a day (BID) | INTRAVENOUS | Status: DC
  Administered 2024-04-08 – 2024-04-13 (×11): 3 mL via INTRAVENOUS

## 2024-04-08 MED ORDER — LABETALOL HCL 5 MG/ML IV SOLN
10.0000 mg | INTRAVENOUS | Status: DC | PRN
  Administered 2024-04-08: 09:00:00 10 mg via INTRAVENOUS
  Filled 2024-04-08: qty 4

## 2024-04-08 MED ORDER — BUMETANIDE 0.25 MG/ML IJ SOLN
2.0000 mg | Freq: Three times a day (TID) | INTRAMUSCULAR | Status: DC
  Administered 2024-04-08 – 2024-04-11 (×9): 2 mg via INTRAVENOUS
  Filled 2024-04-08 (×13): qty 8

## 2024-04-08 MED ORDER — NITROGLYCERIN 2 % TD OINT
1.0000 [in_us] | TOPICAL_OINTMENT | Freq: Once | TRANSDERMAL | Status: AC
  Administered 2024-04-08: 05:00:00 1 [in_us] via TOPICAL
  Filled 2024-04-08: qty 1

## 2024-04-08 MED ORDER — ACETAMINOPHEN 650 MG RE SUPP: 650.0000 mg | Freq: Four times a day (QID) | RECTAL | Status: DC | PRN

## 2024-04-08 MED ORDER — ATORVASTATIN CALCIUM 40 MG PO TABS
40.0000 mg | ORAL_TABLET | Freq: Every day | ORAL | Status: DC
  Administered 2024-04-08 – 2024-04-13 (×6): 40 mg via ORAL
  Filled 2024-04-08 (×6): qty 1

## 2024-04-08 MED ORDER — SODIUM CHLORIDE 0.9% FLUSH
3.0000 mL | Freq: Two times a day (BID) | INTRAVENOUS | Status: DC
  Administered 2024-04-08 – 2024-04-12 (×8): 3 mL via INTRAVENOUS

## 2024-04-08 MED ORDER — INSULIN ASPART 100 UNIT/ML IJ SOLN
0.0000 [IU] | Freq: Three times a day (TID) | INTRAMUSCULAR | Status: DC
  Administered 2024-04-08 (×2): 2 [IU] via SUBCUTANEOUS
  Administered 2024-04-09: 08:00:00 1 [IU] via SUBCUTANEOUS
  Administered 2024-04-09 (×2): 2 [IU] via SUBCUTANEOUS
  Administered 2024-04-10: 1 [IU] via SUBCUTANEOUS
  Administered 2024-04-10: 2 [IU] via SUBCUTANEOUS
  Administered 2024-04-10: 1 [IU] via SUBCUTANEOUS
  Administered 2024-04-11: 2 [IU] via SUBCUTANEOUS
  Administered 2024-04-11 – 2024-04-13 (×6): 1 [IU] via SUBCUTANEOUS
  Filled 2024-04-08 (×12): qty 1

## 2024-04-08 MED ORDER — ONDANSETRON HCL 4 MG PO TABS: 4.0000 mg | ORAL_TABLET | Freq: Four times a day (QID) | ORAL | Status: DC | PRN

## 2024-04-08 MED ORDER — POLYETHYLENE GLYCOL 3350 17 G PO PACK: 17.0000 g | PACK | Freq: Every day | ORAL | Status: DC | PRN

## 2024-04-08 MED ORDER — FUROSEMIDE 10 MG/ML IJ SOLN
40.0000 mg | Freq: Once | INTRAMUSCULAR | Status: AC
  Administered 2024-04-08: 06:00:00 40 mg via INTRAVENOUS
  Filled 2024-04-08: qty 4

## 2024-04-08 MED ORDER — BISACODYL 10 MG RE SUPP: 10.0000 mg | Freq: Every day | RECTAL | Status: DC | PRN

## 2024-04-08 MED ORDER — SODIUM CHLORIDE 0.9% FLUSH: 3.0000 mL | INTRAVENOUS | Status: DC | PRN

## 2024-04-08 MED ORDER — ACETAMINOPHEN 325 MG PO TABS
650.0000 mg | ORAL_TABLET | Freq: Four times a day (QID) | ORAL | Status: DC | PRN
  Administered 2024-04-10: 650 mg via ORAL
  Filled 2024-04-08: qty 2

## 2024-04-08 MED ORDER — ONDANSETRON HCL 4 MG/2ML IJ SOLN: 4.0000 mg | Freq: Four times a day (QID) | INTRAMUSCULAR | Status: DC | PRN

## 2024-04-08 MED ORDER — FUROSEMIDE 10 MG/ML IJ SOLN: 80.0000 mg | Freq: Two times a day (BID) | INTRAMUSCULAR | Status: DC

## 2024-04-08 MED ORDER — HEPARIN SODIUM (PORCINE) 5000 UNIT/ML IJ SOLN
5000.0000 [IU] | Freq: Three times a day (TID) | INTRAMUSCULAR | Status: DC
  Administered 2024-04-08 – 2024-04-13 (×14): 5000 [IU] via SUBCUTANEOUS
  Filled 2024-04-08 (×14): qty 1

## 2024-04-08 MED ORDER — METOPROLOL TARTRATE 25 MG PO TABS
25.0000 mg | ORAL_TABLET | Freq: Two times a day (BID) | ORAL | Status: DC
  Administered 2024-04-08 – 2024-04-13 (×11): 25 mg via ORAL
  Filled 2024-04-08 (×11): qty 1

## 2024-04-08 MED ORDER — SODIUM CHLORIDE 0.9 % IV SOLN: INTRAVENOUS | Status: AC | PRN

## 2024-04-08 NOTE — ED Provider Notes (Signed)
 [place] EMERGENCY DEPARTMENT AT [facility] Provider Note   CSN: [id number] Arrival date & time: 04/08/24  0405     Patient presents with: Shortness of Breath   Edward Page is a 48 y.o. male.   The history is provided by the patient and a parent.  Patient with history of obesity, CHF, diabetes, hypertension, nephrotic syndrome presents with increasing swelling and shortness of breath.  Patient reports that about 1 week ago he started having increasing lower extremity edema.  Over the past 2 days he has been having increasing shortness of breath, dyspnea on exertion.  No fevers or vomiting.  No chest pain.  He does report a cough.    Past Medical History:  Diagnosis Date   CHF (congestive heart failure) (HCC)    Diabetes mellitus without complication (HCC)    Hypertension    Retinal detachment     Prior to Admission medications  Medication Sig Start Date End Date Taking? Authorizing Provider  atorvastatin  (LIPITOR) 40 MG tablet Take 1 tablet (40 mg total) by mouth daily. 08/12/19   Lee, Joshua K, MD  furosemide  (LASIX ) 80 MG tablet Take 1 tablet (80 mg total) by mouth 2 (two) times daily. 08/11/19   Jama Fonda POUR, MD  insulin  NPH-regular Human (70-30) 100 UNIT/ML injection Inject 6 Units into the skin 2 (two) times daily with a meal. 08/11/19   Jama Fonda POUR, MD  Insulin  Syringe-Needle U-100 29G X 1/2 0.3 ML MISC 1 application by Does not apply route 2 (two) times daily. 08/11/19   Lee, Joshua K, MD  lisinopril  (ZESTRIL ) 20 MG tablet Take 1 tablet (20 mg total) by mouth daily. 08/12/19   Lee, Joshua K, MD  metFORMIN (GLUCOPHAGE) 500 MG tablet Take 500 mg by mouth 2 (two) times daily. 08/03/19   [provider]  metoprolol  tartrate (LOPRESSOR ) 25 MG tablet Take 25 mg by mouth 2 (two) times daily. 08/03/19   [provider]    Allergies: Patient has no known allergies.    Review of Systems  Constitutional:  Negative for fever.  Respiratory:  Positive  for cough and shortness of breath.   Cardiovascular:  Positive for leg swelling.    Updated Vital Signs BP (!) 140/118   Pulse 85   Temp 97.9 F (36.6 C) (Oral)   Resp 20   Ht 1.753 m (5' 9)   Wt (!) 158.8 kg   SpO2 97%   BMI 51.69 kg/m   Physical Exam CONSTITUTIONAL: Well developed/well nourished HEAD: Normocephalic/atraumatic ENMT: Mucous membranes moist NECK: supple no meningeal signs CV: S1/S2 noted, no murmurs/rubs/gallops noted LUNGS: Decreased breath sounds bilaterally, no acute distress ABDOMEN: soft, nontender, obese GU:no cva tenderness NEURO: Pt is awake/alert/appropriate, moves all extremitiesx4.  No facial droop.   EXTREMITIES: pulses normal/equal, full ROM, pitting edema to bilateral lower extremities SKIN: warm, color normal PSYCH: no abnormalities of mood noted, alert and oriented to situation  (all labs ordered are listed, but only abnormal results are displayed) Labs Reviewed  CBC - Abnormal; Notable for the following components:      Result Value   RBC 3.60 (*)    Hemoglobin 9.4 (*)    HCT 30.2 (*)    All other components within normal limits  BASIC METABOLIC PANEL WITH GFR - Abnormal; Notable for the following components:   Chloride 113 (*)    CO2 17 (*)    Glucose, Bld 208 (*)    BUN 62 (*)    Creatinine,  Ser 2.70 (*)    Calcium  8.4 (*)    GFR, Estimated 28 (*)    All other components within normal limits  PRO BRAIN NATRIURETIC PEPTIDE - Abnormal; Notable for the following components:   Pro Brain Natriuretic Peptide 4,315.0 (*)    All other components within normal limits  RESP PANEL BY RT-PCR (RSV, FLU A&B, COVID)  [id number]    EKG: EKG Interpretation Date/Time:  Wednesday April 08 2024 04:29:12 EST Ventricular Rate:  95 PR Interval:  157 QRS Duration:  91 QT Interval:  384 QTC Calculation: 483 R Axis:   28  Text Interpretation: Sinus tachycardia Ventricular trigeminy Borderline T wave abnormalities Borderline prolonged QT interval  Confirmed by Midge Golas ([id number]) on 04/08/2024 4:33:50 AM  Radiology: [facility] Chest Port 1 View Result Date: 04/08/2024 EXAM: 1 VIEW(S) XRAY OF THE CHEST 04/08/2024 05:46:00 AM COMPARISON: 08/04/2019 CLINICAL HISTORY: sob FINDINGS: LUNGS AND PLEURA: Low lung volumes. Probable calcified granuloma right mid lung zone. Patchy airspace opacity right mid lung zone. No pleural effusion. No pneumothorax. HEART AND MEDIASTINUM: Cardiomegaly. BONES AND SOFT TISSUES: No acute osseous abnormality. IMPRESSION: 1. Patchy airspace opacity in the right mid lung zone. If there are clinical signs or symptoms of this is compatible with infection pneumonia. In the absence of any clinical signs or symptoms of pneumonia, this is indeterminate and further investigation with CT of the chest would be advised. 2. Cardiomegaly. Electronically signed by: Waddell Calk MD 04/08/2024 06:01 AM EST RP Workstation: [id number]     Procedures   Medications Ordered in the ED  nitroGLYCERIN  (NITROGLYN) 2 % ointment 1 inch (1 inch Topical Given 04/08/24 0454)  furosemide  (LASIX ) injection 40 mg (40 mg Intravenous Given 04/08/24 0548)    Clinical Course as of 04/08/24 0639  Wed Apr 08, 2024  0504 Creatinine(!): 2.70 Acute kidney injury [DW]  0615 Patient history of morbid obesity, CHF and hypertension presents with increasing shortness of breath.  Reports he has had dyspnea exertion, orthopnea and lower extremity edema. Clinically appears to be in acute CHF.  Denies any significant infectious symptoms, he does have elevated BNP.  Nitroglycerin  and Lasix  have been ordered and he will be admitted [DW]  0639 Discussed with Dr. Adefeso for admission [DW]    Clinical Course User Index [DW] Midge Golas, MD                                 Medical Decision Making Amount and/or Complexity of Data Reviewed Labs: ordered. Decision-making details documented in ED Course. Radiology: ordered.  Risk Prescription drug  management. Decision regarding hospitalization.   This patient presents to the ED for concern of shortness of breath, this involves an extensive number of treatment options, and is a complaint that carries with it a high risk of complications and morbidity.  The differential diagnosis includes but is not limited to Acute coronary syndrome, pneumonia, acute pulmonary edema, pneumothorax, acute anemia, pulmonary embolism   Comorbidities that complicate the patient evaluation: Patients presentation is complicated by their history of obesity, hypertension  Social Determinants of Health: Patients no local primary care  increases the complexity of managing their presentation  Additional history obtained: Additional history obtained from family Records reviewed previous admission documents  Lab Tests: I Ordered, and personally interpreted labs.  The pertinent results include: Acute kidney injury, elevated BNP consistent with CHF  Imaging Studies ordered: I ordered imaging studies including X-ray chest  I independently  visualized and interpreted imaging which showed edema versus infiltrate I agree with the radiologist interpretation  Cardiac Monitoring: The patient was maintained on a cardiac monitor.  I personally viewed and interpreted the cardiac monitor which showed an underlying rhythm of:  sinus rhythm  Medicines ordered and prescription drug management: I ordered medication including nitroglycerin  for hypertension Reevaluation of the patient after these medicines showed that the patient    improved   Critical Interventions:   nitroglycerin  and Lasix  for CHF  Consultations Obtained: I requested consultation with the admitting physician [facility], and discussed  findings as well as pertinent plan - they recommend: Admit  Reevaluation: After the interventions noted above, I reevaluated the patient and found that they have :improved  Complexity of problems addressed: Patients  presentation is most consistent with  acute presentation with potential threat to life or bodily function  Disposition: After consideration of the diagnostic results and the patients response to treatment,  I feel that the patent would benefit from admission  .    Given his overall clinical appearance, this is more likely due to underlying acute on chronic CHF Viral panel negative.  Lower suspicion for PE or pneumonia    Final diagnoses:  Acute systolic congestive heart failure [facility])    ED Discharge Orders     None          Midge Golas, MD 04/08/24 [phone]

## 2024-04-08 NOTE — ED Notes (Signed)
Transport called to take patient upstairs 

## 2024-04-08 NOTE — H&P (Signed)
 History and Physical    Patient: Edward Page FMW:[id number] DOB: Jun 14, 1975 DOA: 04/08/2024 DOS: the patient was seen and examined on 04/08/2024 PCP: [name], MD  Patient coming from: Home  Chief Complaint:  Chief Complaint  Patient presents with   Shortness of Breath    HPI: Edward Page is a 48 y.o. male with history of HFmrEF, obesity, nephrotic syndrome,, hypertension, presenting with worsening profound lower extremity edema.  Patient states that despite his ongoing compliance with medical regimen, has noticed worsening leg swelling eventually becoming tight around his legs and knees and up to his abdomen.  Last night noted that he felt he could barely walk prompting his visit to the emergency department.  Denies any fever, purulent sputum, chest pain, nausea, vomiting.  Is compliant with his medications including Lasix .  Denies any recent change in medications.  Upon evaluation emergency department, proBNP greater than 4300, creatinine 2.7.  Chest x-ray noting cardiomegaly and congestion.   Review of Systems: As mentioned in the history of present illness. All other systems reviewed and are negative. Past Medical History:  Diagnosis Date   CHF (congestive heart failure) (HCC)    Diabetes mellitus without complication (HCC)    Hypertension    Retinal detachment    Past Surgical History:  Procedure Laterality Date   RETINAL DETACHMENT SURGERY Left    Social History:  reports that he has never smoked. He has never used smokeless tobacco. He reports that he does not currently use alcohol. He reports that he does not currently use drugs.  Allergies[1]  History reviewed. No pertinent family history.  Prior to Admission medications  Medication Sig Start Date End Date Taking? Authorizing Provider  atorvastatin  (LIPITOR) 40 MG tablet Take 1 tablet (40 mg total) by mouth daily. 08/12/19   Lee, Joshua K, MD  furosemide  (LASIX ) 80 MG tablet Take 1 tablet (80 mg  total) by mouth 2 (two) times daily. 08/11/19   Jama Fonda POUR, MD  insulin  NPH-regular Human (70-30) 100 UNIT/ML injection Inject 6 Units into the skin 2 (two) times daily with a meal. 08/11/19   Jama Fonda POUR, MD  Insulin  Syringe-Needle U-100 29G X 1/2 0.3 ML MISC 1 application by Does not apply route 2 (two) times daily. 08/11/19   Lee, Joshua K, MD  lisinopril  (ZESTRIL ) 20 MG tablet Take 1 tablet (20 mg total) by mouth daily. 08/12/19   Lee, Joshua K, MD  metFORMIN (GLUCOPHAGE) 500 MG tablet Take 500 mg by mouth 2 (two) times daily. 08/03/19   [provider]  metoprolol  tartrate (LOPRESSOR ) 25 MG tablet Take 25 mg by mouth 2 (two) times daily. 08/03/19   [provider]    Physical Exam:  Vitals:   04/08/24 0743 04/08/24 0800 04/08/24 0830 04/08/24 0917  BP:  (!) 172/105 (!) 167/107 (!) 157/86  Pulse: 96 84 86 85  Resp: 19 (!) 21  18  Temp: 97.9 F (36.6 C)   98 F (36.7 C)  TempSrc: Oral   Oral  SpO2: 97% 98% 98% 98%  Weight:      Height:        GENERAL:  Alert, pleasant, no acute distress  HEENT:  EOMI CARDIOVASCULAR:  RRR, no murmurs appreciated RESPIRATORY: Poor air movement, crackles GASTROINTESTINAL:  Soft, nontender, nondistended EXTREMITIES: 3+ tense BL LE pitting edema up to hips NEURO:  No new focal deficits appreciated SKIN:  No rashes noted PSYCH:  Appropriate mood and affect    Data Reviewed:  Chest x-ray  personally reviewed noting congestion and cardiomegaly  DG Chest Port 1 View Result Date: 04/08/2024 EXAM: 1 VIEW(S) XRAY OF THE CHEST 04/08/2024 05:46:00 AM COMPARISON: 08/04/2019 CLINICAL HISTORY: sob FINDINGS: LUNGS AND PLEURA: Low lung volumes. Probable calcified granuloma right mid lung zone. Patchy airspace opacity right mid lung zone. No pleural effusion. No pneumothorax. HEART AND MEDIASTINUM: Cardiomegaly. BONES AND SOFT TISSUES: No acute osseous abnormality. IMPRESSION: 1. Patchy airspace opacity in the right mid lung zone. If there  are clinical signs or symptoms of this is compatible with infection pneumonia. In the absence of any clinical signs or symptoms of pneumonia, this is indeterminate and further investigation with CT of the chest would be advised. 2. Cardiomegaly. Electronically signed by: Waddell Calk MD 04/08/2024 06:01 AM EST RP Workstation: [id number]    Assessment and Plan:  Acute exacerbation of HFmrEF - Severe tense 3+ pitting edema all the way up to his hips.  Elevated proBNP and chest x-ray personally reviewed noting congestion and cardiomegaly.  Patient admits he was compliant with his Lasix  80 mg twice daily.  Will initiate IV Bumex  2 mg every 8 hours.  Strict I's and O's, 1500 mL fluid restriction.  Can consider metolazone 10 mg daily if urine output is not adequate.  Will order echo.  Acute kidney injury - Last known baseline in 2021, creatinine 1.1.  On presentation 2.7.  Likely cardiorenal etiology given profound edema.  Diuresis as above.  Holding ACE/ARB.  Will recheck BMP and magnesium  in AM.  Diabetes mellitus - Will order insulin  sliding scale.  A1c ordered.  Uncontrolled hypertension - Systolic blood pressures in the 170s-190s.  Likely exacerbated by volume overload.  Holding ACE/ARB's.  Metoprolol  on board.  Diuresis as above.  Continue to monitor closely.  Morbid obesity - BMI greater than 15.  Encourage diet and lifestyle modification.  Advance Care Planning:   Code Status: Full Code   Consults: None  Family Communication: None at bedside  Severity of Illness: The appropriate patient status for this patient is INPATIENT. Inpatient status is judged to be reasonable and necessary in order to provide the required intensity of service to ensure the patient's safety. The patient's presenting symptoms, physical exam findings, and initial radiographic and laboratory data in the context of their chronic comorbidities is felt to place them at high risk for further clinical deterioration.  Furthermore, it is not anticipated that the patient will be medically stable for discharge from the hospital within 2 midnights of admission.   * I certify that at the point of admission it is my clinical judgment that the patient will require inpatient hospital care spanning beyond 2 midnights from the point of admission due to high intensity of service, high risk for further deterioration and high frequency of surveillance required.*  Author: Carliss LELON Canales, DO 04/08/2024 9:43 AM  For on call review [email or web address].     [1] No Known Allergies

## 2024-04-08 NOTE — Hospital Course (Signed)
 48 y.o. male with history of HFmrEF, obesity, nephrotic syndrome,, hypertension, presenting with worsening profound lower extremity edema.    Assessment and Plan:   Acute exacerbation of HFmrEF - Severe tense 3+ pitting edema all the way up to his hips.  Elevated proBNP and chest x-ray personally reviewed noting congestion and cardiomegaly.  Patient admits he was compliant with his Lasix  80 mg twice daily.  Will initiate IV Bumex  2 mg every 8 hours.  Strict I's and O's, 1500 mL fluid restriction.  Can consider metolazone 10 mg daily if urine output is not adequate.  Will order echo.   Acute kidney injury - Last known baseline in 2021, creatinine 1.1.  On presentation 2.7.  Likely cardiorenal etiology given profound edema.  Diuresis as above.  Holding ACE/ARB.  Will recheck BMP and magnesium  in AM.   Diabetes mellitus - Will order insulin  sliding scale.  A1c ordered.   Uncontrolled hypertension - Systolic blood pressures in the 170s-190s.  Likely exacerbated by volume overload.  Holding ACE/ARB's.  Metoprolol  on board.  Diuresis as above.  Continue to monitor closely.   Morbid obesity - BMI greater than 15.  Encourage diet and lifestyle modification.

## 2024-04-08 NOTE — ED Notes (Signed)
 Admitting paged regarding PRN for hypertension.

## 2024-04-08 NOTE — Progress Notes (Signed)
 Heart Failure Stewardship Pharmacy Note  PCP: [name], MD PCP-Cardiologist: None  HPI: Edward Page is a 48 y.o. male with CHF, nephrotic syndrome, T2DM, HTN who presented with a 2 week history of progressive shortness of breath and LEE. On admission, proBNP was 4315, creatinine was 2.7 (up from 1.48 4 years ago), and A1c was 7.   Pertinent cardiac history: History of diastolic CHF. TTE 07/2019 noted LVEF of 50-55% with mild concentric LVH and G2DD. TTE this admission noted LVEF of 50-55% with mild concentric LCH and indeterminate diastolic dysfunction.  Pertinent Lab Values: Creatinine, Ser  Date Value Ref Range Status  04/08/2024 2.70 (H) 0.61 - 1.24 mg/dL Final   BUN  Date Value Ref Range Status  04/08/2024 62 (H) 6 - 20 mg/dL Final   Potassium  Date Value Ref Range Status  04/08/2024 4.4 3.5 - 5.1 mmol/L Final   Sodium  Date Value Ref Range Status  04/08/2024 143 135 - 145 mmol/L Final   B Natriuretic Peptide  Date Value Ref Range Status  08/04/2019 1,264.4 (H) 0.0 - 100.0 pg/mL Final    Comment:    Performed at [facility], [address] [place], [place] [address]   Magnesium   Date Value Ref Range Status  08/11/2019 2.0 1.7 - 2.4 mg/dL Final    Comment:    Performed at [facility], [address] [place], [place] [address]   Hgb A1c MFr Bld  Date Value Ref Range Status  08/04/2019 12.1 (H) 4.8 - 5.6 % Final    Comment:    (NOTE)         Prediabetes: 5.7 - 6.4         Diabetes: >6.4         Glycemic control for adults with diabetes: <7.0     Vital Signs: Temp:  [97.9 F (36.6 C)] 97.9 F (36.6 C) (12/17 0743) Pulse Rate:  [75-101] 84 (12/17 0800) Resp:  [18-24] 21 (12/17 0800) BP: (140-194)/(97-157) 172/105 (12/17 0800) SpO2:  [94 %-100 %] 98 % (12/17 0800) Weight:  [158.8 kg (350 lb)] 158.8 kg (350 lb) (12/17 0422) No intake or output data in the 24 hours ending 04/08/24 0840  Current Heart Failure Medications:  Loop  diuretic: Bumex  2 mg IV q8h Beta-Blocker: metoprolol  tartrate 25 mg BID ACEI/ARB/ARNI: none MRA: none SGLT2i: none Other: none  Prior to admission Heart Failure Medications:  Loop diuretic: furosemide  80 mg BID Beta-Blocker: metoprolol  tartrate 25 mg BID ACEI/ARB/ARNI: lisinopril  20 mg daily MRA: none SGLT2i: none Other: none  Assessment: 1. 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 NYHA class III symptoms.  -Symptoms: Reports persistent shortness of breath, swelling, orthopnea, and fatigue. -Volume: Unable to perform in-person assessment, but appears volume overloaded based on symptoms. Creatinine elevated above previous baseline on admission, but stable with diuresis. Reports good urine output. Continue Bumex  2 mg q8h at this time. -Hemodynamics: BP is elevated and HR is 70s. -SGLT2i: Consider adding prior to discharge. -MRA: May be a good candidate for finerenone given HFpEF and CKD. -ARNI: If creatnine stabilizes, can consider transitioning PTA lisinopril  to Entresto. -Currently on metoprolol  tartrate which has no benefit to HFpEF and is a weak antihypertensive. Could consider downtitration and eventual discontinuation in favor of other BP medications.  Plan: 1) Medication changes recommended at this time: -None  2) Patient assistance: -Pending  3) Education: - Patient has been educated on current HF medications and potential  additions to HF medication regimen - Patient verbalizes understanding that over the next few months, these medication doses may change and more medications may be added to optimize HF regimen - Patient has been educated on basic disease state pathophysiology and goals of therapy  Please do not hesitate to reach out with questions or concerns,  Meade Hogeland, PharmD, CPP, BCPS, [facility] Heart Failure Pharmacist  Phone - [phone] 04/09/2024 4:22 PM

## 2024-04-08 NOTE — ED Triage Notes (Signed)
 Pt c/o swelling all over x one week with sob that started a couple of days ago;  Pt states he was at work this evening when he had worsening sob  Pt has non-productive cough

## 2024-04-08 NOTE — Progress Notes (Signed)
 Echocardiogram 2D Echocardiogram has been performed.  Edward Page 04/08/2024, 5:03 PM

## 2024-04-09 DIAGNOSIS — I10 Essential (primary) hypertension: Secondary | ICD-10-CM | POA: Diagnosis not present

## 2024-04-09 DIAGNOSIS — Z794 Long term (current) use of insulin: Secondary | ICD-10-CM | POA: Diagnosis not present

## 2024-04-09 DIAGNOSIS — N179 Acute kidney failure, unspecified: Secondary | ICD-10-CM | POA: Diagnosis not present

## 2024-04-09 DIAGNOSIS — I5023 Acute on chronic systolic (congestive) heart failure: Secondary | ICD-10-CM | POA: Diagnosis not present

## 2024-04-09 DIAGNOSIS — E1165 Type 2 diabetes mellitus with hyperglycemia: Secondary | ICD-10-CM | POA: Diagnosis not present

## 2024-04-09 LAB — CBC
HCT: 30.6 % — ABNORMAL LOW (ref 39.0–52.0)
Hemoglobin: 9.6 g/dL — ABNORMAL LOW (ref 13.0–17.0)
MCH: 26.4 pg (ref 26.0–34.0)
MCHC: 31.4 g/dL (ref 30.0–36.0)
MCV: 84.3 fL (ref 80.0–100.0)
Platelets: 246 K/uL (ref 150–400)
RBC: 3.63 MIL/uL — ABNORMAL LOW (ref 4.22–5.81)
RDW: 14.6 % (ref 11.5–15.5)
WBC: 6.5 K/uL (ref 4.0–10.5)
nRBC: 0 % (ref 0.0–0.2)

## 2024-04-09 LAB — COMPREHENSIVE METABOLIC PANEL WITH GFR
ALT: 31 U/L (ref 0–44)
AST: 17 U/L (ref 15–41)
Albumin: 3.4 g/dL — ABNORMAL LOW (ref 3.5–5.0)
Alkaline Phosphatase: 95 U/L (ref 38–126)
Anion gap: 8 (ref 5–15)
BUN: 56 mg/dL — ABNORMAL HIGH (ref 6–20)
CO2: 24 mmol/L (ref 22–32)
Calcium: 8.7 mg/dL — ABNORMAL LOW (ref 8.9–10.3)
Chloride: 110 mmol/L (ref 98–111)
Creatinine, Ser: 2.69 mg/dL — ABNORMAL HIGH (ref 0.61–1.24)
GFR, Estimated: 28 mL/min — ABNORMAL LOW (ref >60)
Glucose, Bld: 134 mg/dL — ABNORMAL HIGH (ref 70–99)
Potassium: 4.3 mmol/L (ref 3.5–5.1)
Sodium: 143 mmol/L (ref 135–145)
Total Bilirubin: 0.3 mg/dL (ref 0.0–1.2)
Total Protein: 6.4 g/dL — ABNORMAL LOW (ref 6.5–8.1)

## 2024-04-09 LAB — GLUCOSE, CAPILLARY
Glucose-Capillary: 135 mg/dL — ABNORMAL HIGH (ref 70–99)
Glucose-Capillary: 165 mg/dL — ABNORMAL HIGH (ref 70–99)
Glucose-Capillary: 154 mg/dL — ABNORMAL HIGH (ref 70–99)
Glucose-Capillary: 154 mg/dL — ABNORMAL HIGH (ref 70–99)

## 2024-04-09 LAB — MAGNESIUM: Magnesium: 1.9 mg/dL (ref 1.7–2.4)

## 2024-04-09 NOTE — Progress Notes (Signed)
 Mobility Specialist Progress Note:    04/09/24 1300  Mobility  Activity Ambulated with assistance  Level of Assistance Independent  Assistive Device None  Distance Ambulated (ft) 240 ft  Range of Motion/Exercises Active;All extremities  Activity Response Tolerated well  Mobility Referral Yes  Mobility visit 1 Mobility  Mobility Specialist Start Time (ACUTE ONLY) 1300  Mobility Specialist Stop Time (ACUTE ONLY) 1320  Mobility Specialist Time Calculation (min) (ACUTE ONLY) 20 min   Pt received supine, agreeable to mobility. Independently able to stand and ambulate with no AD. Tolerated well, SpO2 96% on RA during ambulation. Returned supine, all needs met.  Cristella Stiver Mobility Specialist Please contact via Special Educational Needs Teacher or  Rehab office at [phone]

## 2024-04-09 NOTE — Plan of Care (Signed)

## 2024-04-09 NOTE — Progress Notes (Signed)
°   04/09/24 1133  TOC Brief Assessment  Insurance and Status Reviewed  Patient has primary care physician Yes  Home environment has been reviewed From Home  Prior level of function: Independent  Prior/Current Home Services No current home services  Social Drivers of Health Review SDOH reviewed no interventions necessary  Readmission risk has been reviewed Yes  Transition of care needs no transition of care needs at this time    Inpatient Care Management (ICM) has reviewed patient and no other ICM needs have been identified at this time. We will continue to monitor patient advancement through interdisciplinary progression rounds. If new patient transition needs arise, please place a ICM consult.

## 2024-04-09 NOTE — Progress Notes (Signed)
 Progress Note   Patient: Edward Page FMW:[id number] DOB: May 12, 1975 DOA: 04/08/2024  DOS: the patient was seen and examined on 04/09/2024   Brief hospital course:  48 y.o. male with history of HFmrEF, obesity, nephrotic syndrome,, hypertension, presenting with worsening profound lower extremity edema.    Assessment and Plan:   Acute exacerbation of HFmrEF - Severe tense 3+ pitting edema all the way up to his hips.  Elevated proBNP and chest x-ray personally reviewed noting congestion and cardiomegaly.  Patient admits he was compliant with his Lasix  80 mg twice daily.  Responding well to IV Bumex  2 mg every 8 hours.  Net -3.5 L so far.  Strict I's and O's, 1500 mL fluid restriction.  Echo noting similar EF 50-55% to previous echo.   Acute kidney injury - Last known baseline in 2021, creatinine 1.1.  On presentation 2.7.  Likely cardiorenal etiology given profound edema however concerning of CKD.  Diuresis as above.  Holding ACE/ARB.  Will recheck BMP and magnesium  in AM.   Diabetes mellitus - 1C7.0 suggesting moderate control.  Continue insulin  sliding scale while inpatient.   Uncontrolled hypertension - Systolic blood pressures in the 170s-190s.  Likely exacerbated by volume overload.  Holding ACE/ARB's.  Metoprolol  on board.  Diuresis as above.  Continue to monitor closely.  Showing improvement from yesterday.   Morbid obesity - BMI greater than 50.  Encourage diet and lifestyle modification.   Subjective: Patient sitting up at bedside, showing improvement this morning.  Patient feels well, doing well with diuresis.  Denies any chest pain, fever, chills, nausea, vomiting, abdominal pain.  Physical Exam:  Vitals:   04/08/24 1752 04/08/24 2000 04/09/24 0423 04/09/24 1246  BP: (!) 153/101 (!) 179/98 (!) 160/112 (!) 156/113  Pulse: 84 82 77 76  Resp: 18 19 20    Temp: 98.7 F (37.1 C) 99.1 F (37.3 C) 98.4 F (36.9 C) 98.8 F (37.1 C)  TempSrc: Oral Oral Oral Axillary  SpO2:  96% 98% 96% 98%  Weight:   (!) 181.5 kg   Height:        GENERAL:  Alert, pleasant, no acute distress  HEENT:  EOMI CARDIOVASCULAR:  RRR, no murmurs appreciated RESPIRATORY: Poor air movement, crackles GASTROINTESTINAL:  Soft, nontender, nondistended EXTREMITIES: 3+ tense BL LE pitting edema up to hips NEURO:  No new focal deficits appreciated SKIN:  No rashes noted PSYCH:  Appropriate mood and affect    Data Reviewed:  Imaging Studies: ECHOCARDIOGRAM COMPLETE Result Date: 04/08/2024    ECHOCARDIOGRAM REPORT   Patient Name:   Edward Page Date of Exam: 04/08/2024 Medical Rec #:  [id number]      Height:       69.0 in Accession #:    [id number]     Weight:       350.0 lb Date of Birth:  [birth year]      BSA:          2.621 m Patient Age:    48 years       BP:           172/108 mmHg Patient Gender: M              HR:           80 bpm. Exam Location:  [facility] Procedure: 2D Echo, Cardiac Doppler and Color Doppler (Both Spectral and Color            Flow Doppler were utilized during procedure). Indications:    CHF - Acute  Diastolic  History:        Patient has prior history of Echocardiogram examinations, most                 recent 08/04/2019. CHF; Risk Factors:Hypertension and Diabetes.  Sonographer:    Juliene Rucks Referring Phys: [id number] COURAGE EMOKPAE  Sonographer Comments: Patient is obese. IMPRESSIONS  1. Left ventricular ejection fraction, by estimation, is 50 to 55%. The left ventricle has low normal function. The left ventricle demonstrates global hypokinesis. The left ventricular internal cavity size was mildly dilated. There is mild concentric left ventricular hypertrophy. Left ventricular diastolic parameters are indeterminate.  2. Right ventricular systolic function is normal. The right ventricular size is normal. There is moderately elevated pulmonary artery systolic pressure. The estimated right ventricular systolic pressure is 50.3 mmHg.  3. The mitral valve is grossly normal.  Trivial mitral valve regurgitation.  4. The aortic valve is tricuspid. Aortic valve regurgitation is not visualized.  5. The inferior vena cava is normal in size with greater than 50% respiratory variability, suggesting right atrial pressure of 3 mmHg. Comparison(s): Prior images reviewed side by side. LVEF 50-55%, indeterminate diastolic function. Normal RV contraction with moderately elevated estimated RVSP. FINDINGS  Left Ventricle: Left ventricular ejection fraction, by estimation, is 50 to 55%. The left ventricle has low normal function. The left ventricle demonstrates global hypokinesis. The left ventricular internal cavity size was mildly dilated. There is mild concentric left ventricular hypertrophy. Left ventricular diastolic parameters are indeterminate. Right Ventricle: The right ventricular size is normal. No increase in right ventricular wall thickness. Right ventricular systolic function is normal. There is moderately elevated pulmonary artery systolic pressure. The tricuspid regurgitant velocity is 3.44 m/s, and with an assumed right atrial pressure of 3 mmHg, the estimated right ventricular systolic pressure is 50.3 mmHg. Left Atrium: Left atrial size was normal in size. Right Atrium: Right atrial size was normal in size. Pericardium: There is no evidence of pericardial effusion. Mitral Valve: The mitral valve is grossly normal. Trivial mitral valve regurgitation. Tricuspid Valve: The tricuspid valve is grossly normal. Tricuspid valve regurgitation is mild. Aortic Valve: The aortic valve is tricuspid. Aortic valve regurgitation is not visualized. Pulmonic Valve: The pulmonic valve was grossly normal. Pulmonic valve regurgitation is trivial. Aorta: The aortic root is normal in size and structure. Venous: The inferior vena cava is normal in size with greater than 50% respiratory variability, suggesting right atrial pressure of 3 mmHg. IAS/Shunts: No atrial level shunt detected by color flow Doppler.  Additional Comments: 3D was performed not requiring image post processing on an independent workstation and was indeterminate.  LEFT VENTRICLE PLAX 2D LVIDd:         5.80 cm      Diastology LVIDs:         4.10 cm      LV e' lateral:   7.83 cm/s LV PW:         1.20 cm      LV E/e' lateral: 13.9 LV IVS:        1.20 cm LVOT diam:     2.30 cm LV SV:         80 LV SV Index:   31 LVOT Area:     4.15 cm  LV Volumes (MOD) LV vol d, MOD A4C: 262.0 ml LV vol s, MOD A4C: 143.0 ml LV SV MOD A4C:     262.0 ml RIGHT VENTRICLE  IVC RV S prime:     12.02 cm/s  IVC diam: 2.00 cm TAPSE (M-mode): 2.3 cm LEFT ATRIUM           Index        RIGHT ATRIUM           Index LA diam:      4.30 cm 1.64 cm/m   RA Area:     12.90 cm LA Vol (A4C): 65.7 ml 25.07 ml/m  RA Volume:   27.30 ml  10.42 ml/m  AORTIC VALVE LVOT Vmax:   102.00 cm/s LVOT Vmean:  67.900 cm/s LVOT VTI:    0.193 m  AORTA Ao Root diam: 3.40 cm Ao Asc diam:  2.90 cm MITRAL VALVE                TRICUSPID VALVE MV Area (PHT): 4.36 cm     TR Peak grad:   47.3 mmHg MV Decel Time: 174 msec     TR Vmax:        344.00 cm/s MV E velocity: 109.00 cm/s MV A velocity: 78.50 cm/s   SHUNTS MV E/A ratio:  1.39         Systemic VTI:  0.19 m                             Systemic Diam: 2.30 cm Jayson Sierras MD Electronically signed by Jayson Sierras MD Signature Date/Time: 04/08/2024/5:10:32 PM    Final    DG Chest Port 1 View Result Date: 04/08/2024 EXAM: 1 VIEW(S) XRAY OF THE CHEST 04/08/2024 05:46:00 AM COMPARISON: 08/04/2019 CLINICAL HISTORY: sob FINDINGS: LUNGS AND PLEURA: Low lung volumes. Probable calcified granuloma right mid lung zone. Patchy airspace opacity right mid lung zone. No pleural effusion. No pneumothorax. HEART AND MEDIASTINUM: Cardiomegaly. BONES AND SOFT TISSUES: No acute osseous abnormality. IMPRESSION: 1. Patchy airspace opacity in the right mid lung zone. If there are clinical signs or symptoms of this is compatible with infection pneumonia. In the  absence of any clinical signs or symptoms of pneumonia, this is indeterminate and further investigation with CT of the chest would be advised. 2. Cardiomegaly. Electronically signed by: Waddell Calk MD 04/08/2024 06:01 AM EST RP Workstation: [id number]    There are no new results to review at this time.  Previous records (including but not limited to H&P, progress notes, nursing notes, TOC management) were reviewed in assessment of this patient.  Labs: CBC: Recent Labs  Lab 04/08/24 0430 04/09/24 0442  WBC 8.3 6.5  HGB 9.4* 9.6*  HCT 30.2* 30.6*  MCV 83.9 84.3  PLT 224 246   Basic Metabolic Panel: Recent Labs  Lab 04/08/24 0430 04/09/24 0442  NA 143 143  K 4.4 4.3  CL 113* 110  CO2 17* 24  GLUCOSE 208* 134*  BUN 62* 56*  CREATININE 2.70* 2.69*  CALCIUM  8.4* 8.7*  MG  --  1.9   Liver Function Tests: Recent Labs  Lab 04/09/24 0442  AST 17  ALT 31  ALKPHOS 95  BILITOT 0.3  PROT 6.4*  ALBUMIN 3.4*   CBG: Recent Labs  Lab 04/08/24 1109 04/08/24 1624 04/08/24 2001 04/09/24 0726 04/09/24 1131  GLUCAP 157* 156* 158* 135* 165*    Scheduled Meds:  atorvastatin   40 mg Oral Daily   bumetanide  (BUMEX ) IV  2 mg Intravenous Q8H   heparin   5,000 Units Subcutaneous Q8H   insulin  aspart  0-5 Units Subcutaneous QHS  insulin  aspart  0-9 Units Subcutaneous TID WC   metoprolol  tartrate  25 mg Oral BID   sodium chloride  flush  3 mL Intravenous Q12H   sodium chloride  flush  3 mL Intravenous Q12H   Continuous Infusions: PRN Meds:.acetaminophen  **OR** acetaminophen , bisacodyl , labetalol , ondansetron  **OR** ondansetron  (ZOFRAN ) IV, polyethylene glycol, sodium chloride  flush  Family Communication: None at bedside  Disposition: Status is: Inpatient Remains inpatient appropriate because: CHF exacerbation     Time spent: 40 minutes  Length of inpatient stay: 1 days  Author: Carliss LELON Canales, DO 04/09/2024 1:52 PM  For on call review [email or web address].

## 2024-04-10 ENCOUNTER — Other Ambulatory Visit (HOSPITAL_COMMUNITY): Payer: Self-pay

## 2024-04-10 ENCOUNTER — Telehealth (HOSPITAL_COMMUNITY): Payer: Self-pay

## 2024-04-10 DIAGNOSIS — I10 Essential (primary) hypertension: Secondary | ICD-10-CM | POA: Diagnosis not present

## 2024-04-10 DIAGNOSIS — I5023 Acute on chronic systolic (congestive) heart failure: Secondary | ICD-10-CM | POA: Diagnosis not present

## 2024-04-10 DIAGNOSIS — N179 Acute kidney failure, unspecified: Secondary | ICD-10-CM | POA: Diagnosis not present

## 2024-04-10 DIAGNOSIS — E1165 Type 2 diabetes mellitus with hyperglycemia: Secondary | ICD-10-CM | POA: Diagnosis not present

## 2024-04-10 LAB — GLUCOSE, CAPILLARY
Glucose-Capillary: 128 mg/dL — ABNORMAL HIGH (ref 70–99)
Glucose-Capillary: 162 mg/dL — ABNORMAL HIGH (ref 70–99)
Glucose-Capillary: 138 mg/dL — ABNORMAL HIGH (ref 70–99)
Glucose-Capillary: 172 mg/dL — ABNORMAL HIGH (ref 70–99)

## 2024-04-10 LAB — COMPREHENSIVE METABOLIC PANEL WITH GFR
ALT: 26 U/L (ref 0–44)
AST: 18 U/L (ref 15–41)
Albumin: 3.1 g/dL — ABNORMAL LOW (ref 3.5–5.0)
Alkaline Phosphatase: 92 U/L (ref 38–126)
Anion gap: 9 (ref 5–15)
BUN: 53 mg/dL — ABNORMAL HIGH (ref 6–20)
CO2: 24 mmol/L (ref 22–32)
Calcium: 8.7 mg/dL — ABNORMAL LOW (ref 8.9–10.3)
Chloride: 109 mmol/L (ref 98–111)
Creatinine, Ser: 2.89 mg/dL — ABNORMAL HIGH (ref 0.61–1.24)
GFR, Estimated: 26 mL/min — ABNORMAL LOW
Glucose, Bld: 134 mg/dL — ABNORMAL HIGH (ref 70–99)
Potassium: 4.5 mmol/L (ref 3.5–5.1)
Sodium: 142 mmol/L (ref 135–145)
Total Bilirubin: 0.3 mg/dL (ref 0.0–1.2)
Total Protein: 6.2 g/dL — ABNORMAL LOW (ref 6.5–8.1)

## 2024-04-10 LAB — CBC
HCT: 30.4 % — ABNORMAL LOW (ref 39.0–52.0)
Hemoglobin: 9.4 g/dL — ABNORMAL LOW (ref 13.0–17.0)
MCH: 25.9 pg — ABNORMAL LOW (ref 26.0–34.0)
MCHC: 30.9 g/dL (ref 30.0–36.0)
MCV: 83.7 fL (ref 80.0–100.0)
Platelets: 242 K/uL (ref 150–400)
RBC: 3.63 MIL/uL — ABNORMAL LOW (ref 4.22–5.81)
RDW: 14.4 % (ref 11.5–15.5)
WBC: 6.5 K/uL (ref 4.0–10.5)
nRBC: 0 % (ref 0.0–0.2)

## 2024-04-10 LAB — MAGNESIUM: Magnesium: 1.8 mg/dL (ref 1.7–2.4)

## 2024-04-10 NOTE — Progress Notes (Signed)
 Heart Failure Stewardship Pharmacy Note  PCP: [name], MD PCP-Cardiologist: None  HPI: Edward Page is a 48 y.o. male with CHF, nephrotic syndrome, T2DM, HTN who presented with a 2 week history of progressive shortness of breath and LEE. On admission, proBNP was 4315, creatinine was 2.7 (up from 1.48 4 years ago), and A1c was 7.   Pertinent cardiac history: History of diastolic CHF. TTE 07/2019 noted LVEF of 50-55% with mild concentric LVH and G2DD. TTE this admission noted LVEF of 50-55% with mild concentric LCH and indeterminate diastolic dysfunction.  Pertinent Lab Values: Creatinine, Ser  Date Value Ref Range Status  04/10/2024 2.89 (H) 0.61 - 1.24 mg/dL Final   BUN  Date Value Ref Range Status  04/10/2024 53 (H) 6 - 20 mg/dL Final   Potassium  Date Value Ref Range Status  04/10/2024 4.5 3.5 - 5.1 mmol/L Final   Sodium  Date Value Ref Range Status  04/10/2024 142 135 - 145 mmol/L Final   B Natriuretic Peptide  Date Value Ref Range Status  08/04/2019 1,264.4 (H) 0.0 - 100.0 pg/mL Final    Comment:    Performed at [facility], [address] [place], [place] [address]   Magnesium   Date Value Ref Range Status  04/10/2024 1.8 1.7 - 2.4 mg/dL Final    Comment:    Performed at [facility], [address] [place], [place] [address]   Hgb A1c MFr Bld  Date Value Ref Range Status  04/08/2024 7.0 (H) 4.8 - 5.6 % Final    Comment:    (NOTE) Diagnosis of Diabetes The following HbA1c ranges recommended by the American Diabetes Association (ADA) may be used as an aid in the diagnosis of diabetes mellitus.  Hemoglobin             Suggested A1C NGSP%              Diagnosis  <5.7                   Non Diabetic  5.7-6.4                Pre-Diabetic  >6.4                   Diabetic  <7.0                   Glycemic control for                       adults with diabetes.     Vital Signs: Temp:  [97.4 F (36.3 C)-98.8 F (37.1 C)] 98.2 F (36.8  C) (12/19 0840) Pulse Rate:  [76-84] 84 (12/19 0840) Cardiac Rhythm: Normal sinus rhythm (12/19 0800) Resp:  [16-20] 16 (12/19 0840) BP: (156-166)/(85-113) 163/85 (12/19 0840) SpO2:  [97 %-98 %] 97 % (12/19 0840) Weight:  [175.4 kg (386 lb 11.2 oz)] 175.4 kg (386 lb 11.2 oz) (12/19 0537)  Intake/Output Summary (Last 24 hours) at 04/10/2024 0900 Last data filed at 04/10/2024 0845 Gross per 24 hour  Intake 720 ml  Output 6300 ml  Net -5580 ml    Current Heart Failure Medications:  Loop diuretic: Bumex  2 mg IV q8h Beta-Blocker: metoprolol  tartrate 25 mg BID ACEI/ARB/ARNI: none MRA: none SGLT2i: none Other: none  Prior to admission Heart Failure Medications:  Loop diuretic: furosemide  80 mg BID Beta-Blocker: metoprolol  tartrate 25 mg BID ACEI/ARB/ARNI: lisinopril  20 mg daily MRA: none SGLT2i: none  Other: none  Assessment: 1. 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 NYHA class III symptoms.  -Symptoms: Reports persistent shortness of breath, swelling, orthopnea, and fatigue. -Volume: Unable to perform in-person assessment, but appears to still be somewhat volume overloaded based on symptoms. Reports LEE is persistent and shortness of breath is decreased but present. Creatinine is up slightly. Urine output is excellent and patient reports it is still light in color. Continue Bumex  2 mg q8h at this time. -Hemodynamics: BP is elevated and HR is 70s. -SGLT2i: Consider adding prior to discharge. -MRA: May be a good candidate for finerenone given HFpEF and CKD if eGFR >25. -ARNI: If creatnine improves, can consider transitioning PTA lisinopril  to Entresto. -Currently on metoprolol  tartrate which has no benefit to HFpEF and is a weak antihypertensive. Could consider downtitration and eventual discontinuation in favor of other BP medications.  Plan: 1) Medication changes recommended at this time: -None  2) Patient assistance: -Pending  3)  Education: - Patient has been educated on current HF medications and potential additions to HF medication regimen - Patient verbalizes understanding that over the next few months, these medication doses may change and more medications may be added to optimize HF regimen - Patient has been educated on basic disease state pathophysiology and goals of therapy  Please do not hesitate to reach out with questions or concerns,  Floyd Lusignan, PharmD, CPP, BCPS, BCCP Heart Failure Pharmacist  Phone - [phone] 04/10/2024 9:00 AM

## 2024-04-10 NOTE — Progress Notes (Signed)
 " Progress Note   Patient: Edward Page FMW:[id number] DOB: [birth year] DOA: 04/08/2024  DOS: the patient was seen and examined on 04/10/2024   Brief hospital course:  48 y.o. male with history of HFmrEF, obesity, nephrotic syndrome,, hypertension, presenting with worsening profound lower extremity edema.    Assessment and Plan:   Acute exacerbation of HFmrEF - Severe tense 3+ pitting edema all the way up to his hips.  Elevated proBNP and chest x-ray personally reviewed noting congestion and cardiomegaly.  Patient admits he was compliant with his Lasix  80 mg twice daily.  Responding well to IV Bumex  2 mg every 8 hours.  Net -7.6 L so far.  Strict I's and O's, 1500 mL fluid restriction.  Echo noting similar EF 50-55% to previous echo.  Cardiac regimen on board.   Acute kidney injury - Last known baseline in 2021, creatinine 1.1.  Currently 2.7-2.8.  Likely cardiorenal etiology given profound edema however concerning of CKD.  Diuresis as above.  Holding ACE/ARB.  Will recheck BMP and magnesium  in AM.   Diabetes mellitus - A1C 7.0 suggesting moderate control.  Continue insulin  sliding scale while inpatient.   Uncontrolled hypertension - Systolic blood pressures in the 170s-190s.  Likely exacerbated by volume overload.  Holding ACE/ARB's.  Metoprolol  on board.  Diuresis as above.  Continue to monitor closely.  Showing improvement from yesterday.   Morbid obesity - BMI greater than 50.  Encourage diet and lifestyle modification.   Subjective: Patient feeling well this morning.  Urine output excellent.  Patient feeling well this morning.  Urine output excellent.  Denies any worsening shortness of breath, fever, chills, chest pain, nausea, vomiting, abdominal pain.  Physical Exam:  Vitals:   04/09/24 2004 04/09/24 2146 04/10/24 0537 04/10/24 0840  BP: (!) 166/107 (!) 164/109 (!) 162/99 (!) 163/85  Pulse: 81 80 76 84  Resp: 20   16  Temp: (!) 97.4 F (36.3 C)  98.8 F (37.1 C) 98.2 F  (36.8 C)  TempSrc: Oral  Oral Oral  SpO2: 97%  98% 97%  Weight:   (!) 175.4 kg   Height:        GENERAL:  Alert, pleasant, no acute distress  HEENT:  EOMI CARDIOVASCULAR:  RRR, no murmurs appreciated RESPIRATORY: Poor air movement, crackles GASTROINTESTINAL:  Soft, nontender, nondistended EXTREMITIES: 3+ tense BL LE pitting edema up to hips NEURO:  No new focal deficits appreciated SKIN:  No rashes noted PSYCH:  Appropriate mood and affect    Data Reviewed:  Imaging Studies: ECHOCARDIOGRAM COMPLETE Result Date: 04/08/2024    ECHOCARDIOGRAM REPORT   Patient Name:   Edward Page Date of Exam: 04/08/2024 Medical Rec #:  [id number]      Height:       69.0 in Accession #:    [id number]     Weight:       350.0 lb Date of Birth:  1975-05-06      BSA:          2.621 m Patient Age:    48 years       BP:           172/108 mmHg Patient Gender: M              HR:           80 bpm. Exam Location:  [facility] Procedure: 2D Echo, Cardiac Doppler and Color Doppler (Both Spectral and Color            Flow Doppler were  utilized during procedure). Indications:    CHF - Acute Diastolic  History:        Patient has prior history of Echocardiogram examinations, most                 recent 08/04/2019. CHF; Risk Factors:Hypertension and Diabetes.  Sonographer:    Juliene Rucks Referring Phys: [id number] COURAGE EMOKPAE  Sonographer Comments: Patient is obese. IMPRESSIONS  1. Left ventricular ejection fraction, by estimation, is 50 to 55%. The left ventricle has low normal function. The left ventricle demonstrates global hypokinesis. The left ventricular internal cavity size was mildly dilated. There is mild concentric left ventricular hypertrophy. Left ventricular diastolic parameters are indeterminate.  2. Right ventricular systolic function is normal. The right ventricular size is normal. There is moderately elevated pulmonary artery systolic pressure. The estimated right ventricular systolic pressure is 50.3 mmHg.  3.  The mitral valve is grossly normal. Trivial mitral valve regurgitation.  4. The aortic valve is tricuspid. Aortic valve regurgitation is not visualized.  5. The inferior vena cava is normal in size with greater than 50% respiratory variability, suggesting right atrial pressure of 3 mmHg. Comparison(s): Prior images reviewed side by side. LVEF 50-55%, indeterminate diastolic function. Normal RV contraction with moderately elevated estimated RVSP. FINDINGS  Left Ventricle: Left ventricular ejection fraction, by estimation, is 50 to 55%. The left ventricle has low normal function. The left ventricle demonstrates global hypokinesis. The left ventricular internal cavity size was mildly dilated. There is mild concentric left ventricular hypertrophy. Left ventricular diastolic parameters are indeterminate. Right Ventricle: The right ventricular size is normal. No increase in right ventricular wall thickness. Right ventricular systolic function is normal. There is moderately elevated pulmonary artery systolic pressure. The tricuspid regurgitant velocity is 3.44 m/s, and with an assumed right atrial pressure of 3 mmHg, the estimated right ventricular systolic pressure is 50.3 mmHg. Left Atrium: Left atrial size was normal in size. Right Atrium: Right atrial size was normal in size. Pericardium: There is no evidence of pericardial effusion. Mitral Valve: The mitral valve is grossly normal. Trivial mitral valve regurgitation. Tricuspid Valve: The tricuspid valve is grossly normal. Tricuspid valve regurgitation is mild. Aortic Valve: The aortic valve is tricuspid. Aortic valve regurgitation is not visualized. Pulmonic Valve: The pulmonic valve was grossly normal. Pulmonic valve regurgitation is trivial. Aorta: The aortic root is normal in size and structure. Venous: The inferior vena cava is normal in size with greater than 50% respiratory variability, suggesting right atrial pressure of 3 mmHg. IAS/Shunts: No atrial level  shunt detected by color flow Doppler. Additional Comments: 3D was performed not requiring image post processing on an independent workstation and was indeterminate.  LEFT VENTRICLE PLAX 2D LVIDd:         5.80 cm      Diastology LVIDs:         4.10 cm      LV e' lateral:   7.83 cm/s LV PW:         1.20 cm      LV E/e' lateral: 13.9 LV IVS:        1.20 cm LVOT diam:     2.30 cm LV SV:         80 LV SV Index:   31 LVOT Area:     4.15 cm  LV Volumes (MOD) LV vol d, MOD A4C: 262.0 ml LV vol s, MOD A4C: 143.0 ml LV SV MOD A4C:     262.0 ml RIGHT VENTRICLE  IVC RV S prime:     12.02 cm/s  IVC diam: 2.00 cm TAPSE (M-mode): 2.3 cm LEFT ATRIUM           Index        RIGHT ATRIUM           Index LA diam:      4.30 cm 1.64 cm/m   RA Area:     12.90 cm LA Vol (A4C): 65.7 ml 25.07 ml/m  RA Volume:   27.30 ml  10.42 ml/m  AORTIC VALVE LVOT Vmax:   102.00 cm/s LVOT Vmean:  67.900 cm/s LVOT VTI:    0.193 m  AORTA Ao Root diam: 3.40 cm Ao Asc diam:  2.90 cm MITRAL VALVE                TRICUSPID VALVE MV Area (PHT): 4.36 cm     TR Peak grad:   47.3 mmHg MV Decel Time: 174 msec     TR Vmax:        344.00 cm/s MV E velocity: 109.00 cm/s MV A velocity: 78.50 cm/s   SHUNTS MV E/A ratio:  1.39         Systemic VTI:  0.19 m                             Systemic Diam: 2.30 cm Jayson Sierras MD Electronically signed by Jayson Sierras MD Signature Date/Time: 04/08/2024/5:10:32 PM    Final    DG Chest Port 1 View Result Date: 04/08/2024 EXAM: 1 VIEW(S) XRAY OF THE CHEST 04/08/2024 05:46:00 AM COMPARISON: 08/04/2019 CLINICAL HISTORY: sob FINDINGS: LUNGS AND PLEURA: Low lung volumes. Probable calcified granuloma right mid lung zone. Patchy airspace opacity right mid lung zone. No pleural effusion. No pneumothorax. HEART AND MEDIASTINUM: Cardiomegaly. BONES AND SOFT TISSUES: No acute osseous abnormality. IMPRESSION: 1. Patchy airspace opacity in the right mid lung zone. If there are clinical signs or symptoms of this is  compatible with infection pneumonia. In the absence of any clinical signs or symptoms of pneumonia, this is indeterminate and further investigation with CT of the chest would be advised. 2. Cardiomegaly. Electronically signed by: Waddell Calk MD 04/08/2024 06:01 AM EST RP Workstation: [id number]    There are no new results to review at this time.  Previous records (including but not limited to H&P, progress notes, nursing notes, TOC management) were reviewed in assessment of this patient.  Labs: CBC: Recent Labs  Lab 04/08/24 0430 04/09/24 0442 04/10/24 0416  WBC 8.3 6.5 6.5  HGB 9.4* 9.6* 9.4*  HCT 30.2* 30.6* 30.4*  MCV 83.9 84.3 83.7  PLT 224 246 242   Basic Metabolic Panel: Recent Labs  Lab 04/08/24 0430 04/09/24 0442 04/10/24 0416  NA 143 143 142  K 4.4 4.3 4.5  CL 113* 110 109  CO2 17* 24 24  GLUCOSE 208* 134* 134*  BUN 62* 56* 53*  CREATININE 2.70* 2.69* 2.89*  CALCIUM  8.4* 8.7* 8.7*  MG  --  1.9 1.8   Liver Function Tests: Recent Labs  Lab 04/09/24 0442 04/10/24 0416  AST 17 18  ALT 31 26  ALKPHOS 95 92  BILITOT 0.3 0.3  PROT 6.4* 6.2*  ALBUMIN 3.4* 3.1*   CBG: Recent Labs  Lab 04/09/24 0726 04/09/24 1131 04/09/24 1729 04/09/24 2104 04/10/24 0825  GLUCAP 135* 165* 154* 154* 128*    Scheduled Meds:  atorvastatin   40 mg Oral Daily   bumetanide  (  BUMEX ) IV  2 mg Intravenous Q8H   heparin   5,000 Units Subcutaneous Q8H   insulin  aspart  0-5 Units Subcutaneous QHS   insulin  aspart  0-9 Units Subcutaneous TID WC   metoprolol  tartrate  25 mg Oral BID   sodium chloride  flush  3 mL Intravenous Q12H   sodium chloride  flush  3 mL Intravenous Q12H   Continuous Infusions: PRN Meds:.acetaminophen  **OR** acetaminophen , bisacodyl , labetalol , ondansetron  **OR** ondansetron  (ZOFRAN ) IV, polyethylene glycol, sodium chloride  flush  Family Communication: none at bedside  Disposition: Status is: Inpatient Remains inpatient appropriate because:  chf     Time spent: 40 minutes  Length of inpatient stay: 2 days  Author: Carliss LELON Canales, DO 04/10/2024 10:54 AM  For on call review [email or web address]   "

## 2024-04-10 NOTE — Telephone Encounter (Signed)
 Pharmacy Patient Advocate Encounter  Insurance verification completed.    The patient is insured through [facility]. Patient has [employer], may use a copay card, and/or apply for patient assistance if available.    Ran test claim for Jardiance 10mg  tablet and the current 30 day co-pay is $585.00 due to deductible.  Ran test claim for Farxiga 10mg  tablet and the current 30 day co-pay is $371.71 due to deductible.  This test claim was processed through [facility]- copay amounts may vary at other pharmacies due to pharmacy/plan contracts, or as the patient moves through the different stages of their insurance plan.

## 2024-04-11 DIAGNOSIS — I5023 Acute on chronic systolic (congestive) heart failure: Secondary | ICD-10-CM | POA: Diagnosis not present

## 2024-04-11 DIAGNOSIS — N179 Acute kidney failure, unspecified: Secondary | ICD-10-CM | POA: Diagnosis not present

## 2024-04-11 DIAGNOSIS — E1165 Type 2 diabetes mellitus with hyperglycemia: Secondary | ICD-10-CM | POA: Diagnosis not present

## 2024-04-11 DIAGNOSIS — I10 Essential (primary) hypertension: Secondary | ICD-10-CM | POA: Diagnosis not present

## 2024-04-11 LAB — BASIC METABOLIC PANEL WITH GFR
Anion gap: 6 (ref 5–15)
BUN: 48 mg/dL — ABNORMAL HIGH (ref 6–20)
CO2: 28 mmol/L (ref 22–32)
Calcium: 8.6 mg/dL — ABNORMAL LOW (ref 8.9–10.3)
Chloride: 107 mmol/L (ref 98–111)
Creatinine, Ser: 2.73 mg/dL — ABNORMAL HIGH (ref 0.61–1.24)
GFR, Estimated: 28 mL/min — ABNORMAL LOW
Glucose, Bld: 146 mg/dL — ABNORMAL HIGH (ref 70–99)
Potassium: 4.6 mmol/L (ref 3.5–5.1)
Sodium: 142 mmol/L (ref 135–145)

## 2024-04-11 LAB — GLUCOSE, CAPILLARY
Glucose-Capillary: 138 mg/dL — ABNORMAL HIGH (ref 70–99)
Glucose-Capillary: 153 mg/dL — ABNORMAL HIGH (ref 70–99)
Glucose-Capillary: 148 mg/dL — ABNORMAL HIGH (ref 70–99)
Glucose-Capillary: 168 mg/dL — ABNORMAL HIGH (ref 70–99)

## 2024-04-11 LAB — MAGNESIUM: Magnesium: 1.8 mg/dL (ref 1.7–2.4)

## 2024-04-11 MED ORDER — FUROSEMIDE 10 MG/ML IJ SOLN
60.0000 mg | Freq: Three times a day (TID) | INTRAMUSCULAR | Status: DC
  Administered 2024-04-11 – 2024-04-12 (×3): 60 mg via INTRAVENOUS
  Filled 2024-04-11 (×3): qty 6

## 2024-04-11 NOTE — Plan of Care (Signed)

## 2024-04-11 NOTE — Progress Notes (Signed)
 " Progress Note   Patient: Edward Page FMW:[id number] DOB: [birth year] DOA: 04/08/2024  DOS: the patient was seen and examined on 04/11/2024   Brief hospital course:  48 y.o. male with history of HFmrEF, obesity, nephrotic syndrome,, hypertension, presenting with worsening profound lower extremity edema.    Assessment and Plan:   Acute exacerbation of HFmrEF - Severe tense 3+ pitting edema all the way up to his hips.  Elevated proBNP and chest x-ray personally reviewed noting congestion and cardiomegaly.  Patient admits he was compliant with his Lasix  80 mg twice daily.  Responding well to IV Bumex  2 mg, however will need to switch to IV Lasix  due to Bumex  shortage.  Will initiate IV Lasix  60 mg every 8 hours.  Net -13.2 L so far.  Strict I's and O's, 1500 mL fluid restriction.  Echo noting similar EF 50-55% to previous echo.  Cardiac regimen on board.   Acute kidney injury - Last known baseline in 2021, creatinine 1.1.  Currently 2.7-2.8.  Likely cardiorenal etiology given profound edema however concerning of CKD.  Diuresis as above.  Holding ACE/ARB.  Will recheck BMP and magnesium  in AM.   Diabetes mellitus - A1C 7.0 suggesting moderate control.  Continue insulin  sliding scale while inpatient.   Uncontrolled hypertension - Systolic blood pressures in the 170s-190s.  Likely exacerbated by volume overload.  Holding ACE/ARB's.  Metoprolol  on board.  Diuresis as above.  Continue to monitor closely.  Showing improvement from yesterday.   Morbid obesity - BMI greater than 50.  Encourage diet and lifestyle modification.   Subjective: Patient feeling well this morning.  Continues to do excellent with diuresis.  Just beginning to see some wrinkles in his feet.  Feeling an improvement in ability to move around.  Denies any worsening shortness of breath, fever, chills, chest pain, nausea, vomiting, abdominal pain.  Amenable to staying for IV optimum diuresis.  Physical Exam:  Vitals:    04/10/24 0840 04/10/24 1303 04/10/24 1946 04/11/24 0524  BP: (!) 163/85 (!) 162/96 (!) 169/90 (!) 177/105  Pulse: 84 78 78 82  Resp: 16 19    Temp: 98.2 F (36.8 C) 98.6 F (37 C) 98.2 F (36.8 C) 98 F (36.7 C)  TempSrc: Oral  Oral Oral  SpO2: 97% 96% 97% 97%  Weight:    (!) 170.6 kg  Height:        GENERAL:  Alert, pleasant, no acute distress  HEENT:  EOMI CARDIOVASCULAR:  RRR, no murmurs appreciated RESPIRATORY: Poor air movement, crackles GASTROINTESTINAL:  Soft, nontender, nondistended EXTREMITIES: 3+ tense BL LE pitting edema up to hips NEURO:  No new focal deficits appreciated SKIN:  No rashes noted PSYCH:  Appropriate mood and affect    Data Reviewed:  Imaging Studies: ECHOCARDIOGRAM COMPLETE Result Date: 04/08/2024    ECHOCARDIOGRAM REPORT   Patient Name:   Edward Page Date of Exam: 04/08/2024 Medical Rec #:  [id number]      Height:       69.0 in Accession #:    [id number]     Weight:       350.0 lb Date of Birth:  09/25/1975      BSA:          2.621 m Patient Age:    48 years       BP:           172/108 mmHg Patient Gender: M              HR:  80 bpm. Exam Location:  [facility] Procedure: 2D Echo, Cardiac Doppler and Color Doppler (Both Spectral and Color            Flow Doppler were utilized during procedure). Indications:    CHF - Acute Diastolic  History:        Patient has prior history of Echocardiogram examinations, most                 recent 08/04/2019. CHF; Risk Factors:Hypertension and Diabetes.  Sonographer:    Juliene Rucks Referring Phys: [id number] COURAGE EMOKPAE  Sonographer Comments: Patient is obese. IMPRESSIONS  1. Left ventricular ejection fraction, by estimation, is 50 to 55%. The left ventricle has low normal function. The left ventricle demonstrates global hypokinesis. The left ventricular internal cavity size was mildly dilated. There is mild concentric left ventricular hypertrophy. Left ventricular diastolic parameters are indeterminate.  2.  Right ventricular systolic function is normal. The right ventricular size is normal. There is moderately elevated pulmonary artery systolic pressure. The estimated right ventricular systolic pressure is 50.3 mmHg.  3. The mitral valve is grossly normal. Trivial mitral valve regurgitation.  4. The aortic valve is tricuspid. Aortic valve regurgitation is not visualized.  5. The inferior vena cava is normal in size with greater than 50% respiratory variability, suggesting right atrial pressure of 3 mmHg. Comparison(s): Prior images reviewed side by side. LVEF 50-55%, indeterminate diastolic function. Normal RV contraction with moderately elevated estimated RVSP. FINDINGS  Left Ventricle: Left ventricular ejection fraction, by estimation, is 50 to 55%. The left ventricle has low normal function. The left ventricle demonstrates global hypokinesis. The left ventricular internal cavity size was mildly dilated. There is mild concentric left ventricular hypertrophy. Left ventricular diastolic parameters are indeterminate. Right Ventricle: The right ventricular size is normal. No increase in right ventricular wall thickness. Right ventricular systolic function is normal. There is moderately elevated pulmonary artery systolic pressure. The tricuspid regurgitant velocity is 3.44 m/s, and with an assumed right atrial pressure of 3 mmHg, the estimated right ventricular systolic pressure is 50.3 mmHg. Left Atrium: Left atrial size was normal in size. Right Atrium: Right atrial size was normal in size. Pericardium: There is no evidence of pericardial effusion. Mitral Valve: The mitral valve is grossly normal. Trivial mitral valve regurgitation. Tricuspid Valve: The tricuspid valve is grossly normal. Tricuspid valve regurgitation is mild. Aortic Valve: The aortic valve is tricuspid. Aortic valve regurgitation is not visualized. Pulmonic Valve: The pulmonic valve was grossly normal. Pulmonic valve regurgitation is trivial. Aorta: The  aortic root is normal in size and structure. Venous: The inferior vena cava is normal in size with greater than 50% respiratory variability, suggesting right atrial pressure of 3 mmHg. IAS/Shunts: No atrial level shunt detected by color flow Doppler. Additional Comments: 3D was performed not requiring image post processing on an independent workstation and was indeterminate.  LEFT VENTRICLE PLAX 2D LVIDd:         5.80 cm      Diastology LVIDs:         4.10 cm      LV e' lateral:   7.83 cm/s LV PW:         1.20 cm      LV E/e' lateral: 13.9 LV IVS:        1.20 cm LVOT diam:     2.30 cm LV SV:         80 LV SV Index:   31 LVOT Area:     4.15  cm  LV Volumes (MOD) LV vol d, MOD A4C: 262.0 ml LV vol s, MOD A4C: 143.0 ml LV SV MOD A4C:     262.0 ml RIGHT VENTRICLE             IVC RV S prime:     12.02 cm/s  IVC diam: 2.00 cm TAPSE (M-mode): 2.3 cm LEFT ATRIUM           Index        RIGHT ATRIUM           Index LA diam:      4.30 cm 1.64 cm/m   RA Area:     12.90 cm LA Vol (A4C): 65.7 ml 25.07 ml/m  RA Volume:   27.30 ml  10.42 ml/m  AORTIC VALVE LVOT Vmax:   102.00 cm/s LVOT Vmean:  67.900 cm/s LVOT VTI:    0.193 m  AORTA Ao Root diam: 3.40 cm Ao Asc diam:  2.90 cm MITRAL VALVE                TRICUSPID VALVE MV Area (PHT): 4.36 cm     TR Peak grad:   47.3 mmHg MV Decel Time: 174 msec     TR Vmax:        344.00 cm/s MV E velocity: 109.00 cm/s MV A velocity: 78.50 cm/s   SHUNTS MV E/A ratio:  1.39         Systemic VTI:  0.19 m                             Systemic Diam: 2.30 cm Jayson Sierras MD Electronically signed by Jayson Sierras MD Signature Date/Time: 04/08/2024/5:10:32 PM    Final    DG Chest Port 1 View Result Date: 04/08/2024 EXAM: 1 VIEW(S) XRAY OF THE CHEST 04/08/2024 05:46:00 AM COMPARISON: 08/04/2019 CLINICAL HISTORY: sob FINDINGS: LUNGS AND PLEURA: Low lung volumes. Probable calcified granuloma right mid lung zone. Patchy airspace opacity right mid lung zone. No pleural effusion. No pneumothorax.  HEART AND MEDIASTINUM: Cardiomegaly. BONES AND SOFT TISSUES: No acute osseous abnormality. IMPRESSION: 1. Patchy airspace opacity in the right mid lung zone. If there are clinical signs or symptoms of this is compatible with infection pneumonia. In the absence of any clinical signs or symptoms of pneumonia, this is indeterminate and further investigation with CT of the chest would be advised. 2. Cardiomegaly. Electronically signed by: Waddell Calk MD 04/08/2024 06:01 AM EST RP Workstation: [id number]    There are no new results to review at this time.  Previous records (including but not limited to H&P, progress notes, nursing notes, TOC management) were reviewed in assessment of this patient.  Labs: CBC: Recent Labs  Lab 04/08/24 0430 04/09/24 0442 04/10/24 0416  WBC 8.3 6.5 6.5  HGB 9.4* 9.6* 9.4*  HCT 30.2* 30.6* 30.4*  MCV 83.9 84.3 83.7  PLT 224 246 242   Basic Metabolic Panel: Recent Labs  Lab 04/08/24 0430 04/09/24 0442 04/10/24 0416 04/11/24 0545  NA 143 143 142 142  K 4.4 4.3 4.5 4.6  CL 113* 110 109 107  CO2 17* 24 24 28   GLUCOSE 208* 134* 134* 146*  BUN 62* 56* 53* 48*  CREATININE 2.70* 2.69* 2.89* 2.73*  CALCIUM  8.4* 8.7* 8.7* 8.6*  MG  --  1.9 1.8 1.8   Liver Function Tests: Recent Labs  Lab 04/09/24 0442 04/10/24 0416  AST 17 18  ALT 31 26  ALKPHOS 95 92  BILITOT 0.3 0.3  PROT 6.4* 6.2*  ALBUMIN 3.4* 3.1*   CBG: Recent Labs  Lab 04/10/24 1118 04/10/24 1621 04/10/24 2014 04/11/24 0714 04/11/24 1119  GLUCAP 162* 138* 172* 138* 153*    Scheduled Meds:  atorvastatin   40 mg Oral Daily   bumetanide  (BUMEX ) IV  2 mg Intravenous Q8H   heparin   5,000 Units Subcutaneous Q8H   insulin  aspart  0-5 Units Subcutaneous QHS   insulin  aspart  0-9 Units Subcutaneous TID WC   metoprolol  tartrate  25 mg Oral BID   sodium chloride  flush  3 mL Intravenous Q12H   sodium chloride  flush  3 mL Intravenous Q12H   Continuous Infusions: PRN Meds:.acetaminophen   **OR** acetaminophen , bisacodyl , labetalol , ondansetron  **OR** ondansetron  (ZOFRAN ) IV, polyethylene glycol, sodium chloride  flush  Family Communication: None at bedside  Disposition: Status is: Inpatient Remains inpatient appropriate because: CHF exacerbation     Time spent: 39 minutes  Length of inpatient stay: 3 days  Author: Carliss LELON Canales, DO 04/11/2024 11:55 AM  For on call review [email or web address]   "

## 2024-04-12 ENCOUNTER — Inpatient Hospital Stay (HOSPITAL_COMMUNITY)

## 2024-04-12 DIAGNOSIS — I5023 Acute on chronic systolic (congestive) heart failure: Secondary | ICD-10-CM | POA: Diagnosis not present

## 2024-04-12 LAB — GLUCOSE, CAPILLARY
Glucose-Capillary: 130 mg/dL — ABNORMAL HIGH (ref 70–99)
Glucose-Capillary: 133 mg/dL — ABNORMAL HIGH (ref 70–99)
Glucose-Capillary: 140 mg/dL — ABNORMAL HIGH (ref 70–99)
Glucose-Capillary: 164 mg/dL — ABNORMAL HIGH (ref 70–99)

## 2024-04-12 LAB — CREATININE, URINE, RANDOM: Creatinine, Urine: 45 mg/dL

## 2024-04-12 LAB — OSMOLALITY, URINE: Osmolality, Ur: 335 mosm/kg (ref 300–900)

## 2024-04-12 LAB — SODIUM, URINE, RANDOM: Sodium, Ur: 115 mmol/L

## 2024-04-12 MED ORDER — FUROSEMIDE 10 MG/ML IJ SOLN
60.0000 mg | Freq: Two times a day (BID) | INTRAMUSCULAR | Status: DC
  Administered 2024-04-12 – 2024-04-13 (×2): 60 mg via INTRAVENOUS
  Filled 2024-04-12 (×2): qty 6

## 2024-04-12 NOTE — Plan of Care (Signed)
" °  Problem: Clinical Measurements: Goal: Diagnostic test results will improve Outcome: Not Progressing   Problem: Clinical Measurements: Goal: Cardiovascular complication will be avoided Outcome: Not Progressing   Problem: Fluid Volume: Goal: Ability to maintain a balanced intake and output will improve Outcome: Not Progressing   "

## 2024-04-12 NOTE — Progress Notes (Addendum)
 " PROGRESS NOTE    Edward Page  FMW:[id number] DOB: Apr 27, 1975 DOA: 04/08/2024 PCP: [name], MD   Brief Narrative:   48 y.o. male with history of HFmrEF, obesity, nephrotic syndrome,, hypertension, presenting with worsening profound lower extremity edema.   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   -On IV lasix  60 mg Q8H, changed to Q12H today. May transition to oral diuretics in the next 24 hours or so.     Strict I's and O's, 1500 mL fluid restriction.  Echo noting similar EF 50-55% to previous echo.    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  Currently 2.6-2.8.  -Likely cardiorenal syndrome -He was seen by [facility] back in 2021 but hasn't seen any nephrologist afterwards -He is scheduled to see Dr Jackson in [place], [place] in Feb. 2026. -Discussed with Dr Ephriam Stank -Ordered US  renal as well as urine osmolality, sodium and creatinine.   Diabetes mellitus type 2,POA: - A1C 7.0 suggesting moderate control.  Continue insulin  sliding scale while inpatient.   Hypertensive Urgency: - Systolic blood pressures in the 170s-190s.  Likely exacerbated by volume overload.  Holding ACE/ARB's.  Metoprolol  on board.  Diuresis as above.  Continue to monitor closely.  Showing improvement from yesterday.   Class III obesity,POA; - BMI 52.  Likely a candidate for GLP-1 agonist vs MBS as he does have obesity related complications.  Disposition: Home    DVT prophylaxis: heparin  injection 5,000 Units Start: 04/08/24 2200 SCDs Start: 04/08/24 0842 Place TED hose Start:  04/08/24 0842     Code Status: Full Code Family Communication:  None at the bedside Status is: Inpatient Remains inpatient appropriate because: Acute CHF, kidney dysfucntion    Subjective:    Examination:  General exam: Appears calm and comfortable  Respiratory system: Clear to auscultation. Respiratory effort normal. Cardiovascular system: S1 & S2 heard, RRR. No JVD, murmurs, rubs, gallops or clicks. 1+ pedal edema. Gastrointestinal system: Abdomen is nondistended, soft and nontender. No organomegaly or masses felt. Normal bowel sounds heard. Central nervous system: Alert and oriented. No focal neurological deficits. Extremities: Symmetric 5 x 5 power. Skin: No rashes, lesions or ulcers Psychiatry: Judgement and insight appear normal. Mood & affect appropriate.       Diet Orders (From admission, onward)     Start     Ordered   04/08/24 0956  Diet heart healthy/carb modified Room service appropriate? Yes; Fluid consistency: Thin; Fluid restriction: 1500 mL Fluid  Diet effective now       Question Answer Comment  Diet-HS Snack? Nothing   Room service appropriate? Yes   Fluid consistency: Thin   Fluid restriction: 1500 mL Fluid      04/08/24 0955            Objective: Vitals:   04/11/24 2003 04/12/24 0341 04/12/24 0346 04/12/24 0442  BP:  (!) 173/105 (!) 151/82   Pulse:  71    Resp: 18     Temp:  98.9 F (37.2 C)    TempSrc:  Oral    SpO2:  93%    Weight:    ROLLEN)  159.9 kg  Height:        Intake/Output Summary (Last 24 hours) at 04/12/2024 0927 Last data filed at 04/12/2024 0743 Gross per 24 hour  Intake 680 ml  Output 4690 ml  Net -4010 ml   Filed Weights   04/10/24 0537 04/11/24 0524 04/12/24 0442  Weight: (!) 175.4 kg (!) 170.6 kg (!) 159.9 kg    Scheduled Meds:  atorvastatin   40 mg Oral Daily   furosemide   60 mg Intravenous Q8H   heparin   5,000 Units Subcutaneous Q8H   insulin  aspart  0-5 Units Subcutaneous QHS   insulin  aspart  0-9 Units  Subcutaneous TID WC   metoprolol  tartrate  25 mg Oral BID   sodium chloride  flush  3 mL Intravenous Q12H   sodium chloride  flush  3 mL Intravenous Q12H   Continuous Infusions:  Nutritional status     Body mass index is 52.06 kg/m.  Data Reviewed:   CBC: Recent Labs  Lab 04/08/24 0430 04/09/24 0442 04/10/24 0416  WBC 8.3 6.5 6.5  HGB 9.4* 9.6* 9.4*  HCT 30.2* 30.6* 30.4*  MCV 83.9 84.3 83.7  PLT 224 246 242   Basic Metabolic Panel: Recent Labs  Lab 04/08/24 0430 04/09/24 0442 04/10/24 0416 04/11/24 0545  NA 143 143 142 142  K 4.4 4.3 4.5 4.6  CL 113* 110 109 107  CO2 17* 24 24 28   GLUCOSE 208* 134* 134* 146*  BUN 62* 56* 53* 48*  CREATININE 2.70* 2.69* 2.89* 2.73*  CALCIUM  8.4* 8.7* 8.7* 8.6*  MG  --  1.9 1.8 1.8   GFR: Estimated Creatinine Clearance: 49.8 mL/min (A) (by C-G formula based on SCr of 2.73 mg/dL (H)). Liver Function Tests: Recent Labs  Lab 04/09/24 0442 04/10/24 0416  AST 17 18  ALT 31 26  ALKPHOS 95 92  BILITOT 0.3 0.3  PROT 6.4* 6.2*  ALBUMIN 3.4* 3.1*   No results for input(s): LIPASE, AMYLASE in the last 168 hours. No results for input(s): AMMONIA in the last 168 hours. Coagulation Profile: No results for input(s): INR, PROTIME in the last 168 hours. Cardiac Enzymes: No results for input(s): CKTOTAL, CKMB, CKMBINDEX, TROPONINI in the last 168 hours. BNP (last 3 results) Recent Labs    04/08/24 0430  PROBNP 4,315.0*   HbA1C: No results for input(s): HGBA1C in the last 72 hours. CBG: Recent Labs  Lab 04/11/24 0714 04/11/24 1119 04/11/24 1622 04/11/24 2120 04/12/24 0727  GLUCAP 138* 153* 148* 168* 130*   Lipid Profile: No results for input(s): CHOL, HDL, LDLCALC, TRIG, CHOLHDL, LDLDIRECT in the last 72 hours. Thyroid Function Tests: No results for input(s): TSH, T4TOTAL, FREET4, T3FREE, THYROIDAB in the last 72 hours. Anemia Panel: No results for input(s): VITAMINB12,  FOLATE, FERRITIN, TIBC, IRON, RETICCTPCT in the last 72 hours. Sepsis Labs: No results for input(s): PROCALCITON, LATICACIDVEN in the last 168 hours.  Recent Results (from the past 240 hours)  Resp panel by RT-PCR (RSV, Flu A&B, Covid) Anterior Nasal Swab     Status: None   Collection Time: 04/08/24  4:53 AM   Specimen: Anterior Nasal Swab  Result Value Ref Range Status   SARS Coronavirus 2 by RT PCR NEGATIVE NEGATIVE Final    Comment: (NOTE) SARS-CoV-2 target nucleic acids are NOT DETECTED.  The SARS-CoV-2 RNA is generally detectable in upper respiratory specimens during the acute phase of infection. The lowest concentration of SARS-CoV-2 viral copies this assay can detect is 138 copies/mL. A negative result does not preclude SARS-Cov-2  infection and should not be used as the sole basis for treatment or other patient management decisions. A negative result may occur with  improper specimen collection/handling, submission of specimen other than nasopharyngeal swab, presence of viral mutation(s) within the areas targeted by this assay, and inadequate number of viral copies(<138 copies/mL). A negative result must be combined with clinical observations, patient history, and epidemiological information. The expected result is Negative.  Fact Sheet for Patients:  bloggercourse.com  Fact Sheet for Healthcare Providers:  seriousbroker.it  This test is no t yet approved or cleared by the [place]  FDA and  has been authorized for detection and/or diagnosis of SARS-CoV-2 by FDA under an Emergency Use Authorization (EUA). This EUA will remain  in effect (meaning this test can be used) for the duration of the COVID-19 declaration under Section 564(b)(1) of the Act, 21 U.S.C.section 360bbb-3(b)(1), unless the authorization is terminated  or revoked sooner.       Influenza A by PCR NEGATIVE NEGATIVE Final   Influenza B  by PCR NEGATIVE NEGATIVE Final    Comment: (NOTE) The Xpert Xpress SARS-CoV-2/FLU/RSV plus assay is intended as an aid in the diagnosis of influenza from Nasopharyngeal swab specimens and should not be used as a sole basis for treatment. Nasal washings and aspirates are unacceptable for Xpert Xpress SARS-CoV-2/FLU/RSV testing.  Fact Sheet for Patients: bloggercourse.com  Fact Sheet for Healthcare Providers: seriousbroker.it  This test is not yet approved or cleared by the [place]  FDA and has been authorized for detection and/or diagnosis of SARS-CoV-2 by FDA under an Emergency Use Authorization (EUA). This EUA will remain in effect (meaning this test can be used) for the duration of the COVID-19 declaration under Section 564(b)(1) of the Act, 21 U.S.C. section 360bbb-3(b)(1), unless the authorization is terminated or revoked.     Resp Syncytial Virus by PCR NEGATIVE NEGATIVE Final    Comment: (NOTE) Fact Sheet for Patients: bloggercourse.com  Fact Sheet for Healthcare Providers: seriousbroker.it  This test is not yet approved or cleared by the [place]  FDA and has been authorized for detection and/or diagnosis of SARS-CoV-2 by FDA under an Emergency Use Authorization (EUA). This EUA will remain in effect (meaning this test can be used) for the duration of the COVID-19 declaration under Section 564(b)(1) of the Act, 21 U.S.C. section 360bbb-3(b)(1), unless the authorization is terminated or revoked.  Performed at [facility], [address] [place], [place] [address]          Radiology Studies: No results found.         LOS: 4 days   Time spent= 35 mins    Deliliah Room, MD [facility]  If 7PM-7AM, please contact night-coverage  04/12/2024, 9:27 AM  "

## 2024-04-12 NOTE — Plan of Care (Signed)

## 2024-04-13 DIAGNOSIS — I5023 Acute on chronic systolic (congestive) heart failure: Secondary | ICD-10-CM | POA: Diagnosis not present

## 2024-04-13 LAB — GLUCOSE, CAPILLARY: Glucose-Capillary: 140 mg/dL — ABNORMAL HIGH (ref 70–99)

## 2024-04-13 MED ORDER — FUROSEMIDE 80 MG PO TABS: 80.0000 mg | ORAL_TABLET | Freq: Two times a day (BID) | ORAL | 0 refills | Status: AC

## 2024-04-13 MED ORDER — DAPAGLIFLOZIN PROPANEDIOL 10 MG PO TABS: 10.0000 mg | ORAL_TABLET | Freq: Every day | ORAL | 0 refills | Status: AC

## 2024-04-13 MED ORDER — LISINOPRIL 20 MG PO TABS: 20.0000 mg | ORAL_TABLET | Freq: Every day | ORAL | 0 refills | Status: AC

## 2024-04-13 NOTE — Discharge Instructions (Signed)
 SABRA

## 2024-04-13 NOTE — Plan of Care (Signed)
" °  Problem: Education: Goal: Knowledge of General Education information will improve Description: Including pain rating scale, medication(s)/side effects and non-pharmacologic comfort measures Outcome: Progressing   Problem: Clinical Measurements: Goal: Ability to maintain clinical measurements within normal limits will improve Outcome: Progressing Goal: Respiratory complications will improve Outcome: Progressing Goal: Cardiovascular complication will be avoided Outcome: Progressing   Problem: Activity: Goal: Risk for activity intolerance will decrease Outcome: Progressing   Problem: Pain Managment: Goal: General experience of comfort will improve and/or be controlled Outcome: Progressing   Problem: Safety: Goal: Ability to remain free from injury will improve Outcome: Progressing   Problem: Education: Goal: Ability to describe self-care measures that may prevent or decrease complications (Diabetes Survival Skills Education) will improve Outcome: Progressing   Problem: Metabolic: Goal: Ability to maintain appropriate glucose levels will improve Outcome: Progressing   "

## 2024-04-13 NOTE — Progress Notes (Signed)
 Heart Failure Nurse Navigator Progress Note  PCP: [name], MD PCP-Cardiologist: Dr. Cleotilde in [place], [place] Admission Diagnosis: Acute systolic congestive heart failure [facility]) Admitted from: Home  Presentation:   Edward Page is a 48 y.o. male. He presented with shortness of breath. Also increasing lower extremity edema for 1 week and a cough. Patient with a history of Obesity, CHF, T2DM, HTN and Nephrotic Syndrome. Pro-BNP-4,315, creatinine was 2.7 and A1c 7.   Chest x-ray: Cardiomegaly.  ECHO/ LVEF: 50-55% with mild concentric LCH and indeterminate diastolic dysfunction  Clinical Course:  Past Medical History:  Diagnosis Date   CHF (congestive heart failure) (HCC)    Diabetes mellitus without complication (HCC)    Hypertension    Retinal detachment      Social History   Socioeconomic History   Marital status: Married    Spouse name: Not on file   Number of children: Not on file   Years of education: Not on file   Highest education level: Not on file  Occupational History   Not on file  Tobacco Use   Smoking status: Never   Smokeless tobacco: Never  Vaping Use   Vaping status: Never Used  Substance and Sexual Activity   Alcohol use: Not Currently   Drug use: Not Currently   Sexual activity: Not on file  Other Topics Concern   Not on file  Social History Narrative   Not on file   Social Drivers of Health   Tobacco Use: Low Risk (04/08/2024)   Patient History    Smoking Tobacco Use: Never    Smokeless Tobacco Use: Never    Passive Exposure: Not on file  Financial Resource Strain: Not on file  Food Insecurity: No Food Insecurity (04/08/2024)   Epic    Worried About Programme Researcher, Broadcasting/film/video in the Last Year: Never true    Ran Out of Food in the Last Year: Never true  Transportation Needs: No Transportation Needs (04/08/2024)   Epic    Lack of Transportation (Medical): No    Lack of Transportation (Non-Medical): No  Physical Activity: Not on file   Stress: Not on file  Social Connections: Moderately Integrated (04/08/2024)   Social Connection and Isolation Panel    Frequency of Communication with Friends and Family: Once a week    Frequency of Social Gatherings with Friends and Family: Once a week    Attends Religious Services: 1 to 4 times per year    Active Member of [employer] or Organizations: Yes    Attends Banker Meetings: 1 to 4 times per year    Marital Status: Married  Depression (EYV7-0): Not on file  Alcohol Screen: Not on file  Housing: Low Risk (04/08/2024)   Epic    Unable to Pay for Housing in the Last Year: No    Number of Times Moved in the Last Year: 0    Homeless in the Last Year: No  Utilities: Not At Risk (04/08/2024)   Epic    Threatened with loss of utilities: No  Health Literacy: Not on file   Education Assessment and Provision:  Detailed education and instructions provided on heart failure disease management including the following:  Signs and symptoms of Heart Failure When to call the physician Importance of daily weights Low sodium diet Fluid restriction Medication management Anticipated future follow-up appointments  Patient education given on each of the above topics.  Patient acknowledges understanding via teach back method and acceptance of all instructions.  Education Materials:  Living Better With Heart Failure Booklet, HF zone tool, & Daily Weight Tracker Tool.  Patient has scale at home: No.  Patient shared that he was able to obtain one. Patient has pill box at home: Yes.    High Risk Criteria for Readmission and/or Poor Patient Outcomes: Heart failure hospital admissions (last 6 months): 1  No Show rate: 0% Difficult social situation: None determined at this time. Demonstrates medication adherence: Yes Primary Language: English Literacy level: Reading, Writing & Comprehension  Barriers of Care:   Daily Weights-Patient said he was able to get a scale.  Diet &  Fluid Restrictions  Considerations/Referrals:  Referral made to Heart Failure Pharmacist Stewardship: Yes Referral made to Heart Failure CSW/NCM TOC: No Referral made to Heart & Vascular TOC clinic: Yes   Items for Follow-up on DC/TOC: Daily Weights Diet & Fluid Restrictions Continued Heart Failure Education  Charmaine Pines, RN, BSN [facility] Heart Failure Navigator Secure Chat Only

## 2024-04-13 NOTE — Discharge Summary (Signed)
 " Physician Discharge Summary   Patient: Edward Page MRN: [id number] DOB: 14-Jun-1975  Admit date:     04/08/2024  Discharge date: 04/13/2024  Discharge Physician: Edward Page   PCP: Edward Page   Recommendations at discharge:   Follow-up with your PCP in 1 week. Follow-up outpatient with your nephrologist, Edward Page on the scheduled appointment. Take medications as prescribed and check your blood pressure and heart rate daily.  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 male with history of HFmrEF, obesity, nephrotic syndrome,, hypertension, presenting with worsening profound lower extremity edema.   Acute exacerbation of HFpEF,POA: NYHA II. Resolved -  Patient admits he was compliant with his Lasix  80 mg twice daily.   - He received intravenous Lasix  in the hospital.  Resume home regimen of oral Lasix  as well as chlorthalidone on discharge.    Strict I's and O's, 1500 mL fluid restrictio and heart healthy diet.  Echo noting similar EF 50-55% to previous echo.  No symptoms of chest pain or shortness of breath at the time of the discharge.   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  Currently 2.6-2.8.  -Likely cardiorenal syndrome -He was seen by [facility] back in 2021 but hasn't seen any nephrologist afterwards -He is scheduled to see Dr Edward in [place], [place] in Feb. 2026. -Discussed with Dr Edward Page - Renal ultrasound did not show evidence of obstructive uropathy -Continue with lisinopril .  Held finerenone.   Diabetes mellitus type 2,POA: - A1C 7.0 suggesting  moderate control.  Continue with home insulin  regimen on discharge  Hypertensive Urgency: Resolved - Initial systolic blood pressures in the 170s-190s.  Likely exacerbated by volume overload.  Restarted lisinopril  on discharge.  Continue with metoprolol  as well as diuretics.  Check blood pressure and heart rate daily.   Class III obesity,POA; - BMI 52.  Likely a candidate for GLP-1 agonist vs MBS as he does have obesity related complications.   Disposition: Home        Consultants: None Procedures performed: None  Disposition: Home Diet recommendation:  Cardiac and Carb modified diet DISCHARGE MEDICATION: Allergies as of 04/13/2024   No Known Allergies      Medication List     STOP taking these medications    Kerendia 10 MG Tabs Generic drug: Finerenone       TAKE these medications    amLODipine 10 MG tablet Commonly known as: NORVASC Take 10 mg by mouth daily.   atorvastatin  40 MG tablet Commonly known as: LIPITOR Take 1 tablet (40 mg total) by mouth daily.   chlorthalidone 25 MG tablet Commonly known as: HYGROTON Take 25 mg by mouth daily.   dapagliflozin  propanediol 10 MG Tabs tablet Commonly known as: Farxiga  Take 1 tablet (10 mg total) by mouth daily.   furosemide  80 MG tablet Commonly known as: LASIX  Take 1 tablet (80 mg total) by mouth 2 (two) times daily.   insulin  NPH-regular Human (70-30) 100 UNIT/ML injection Inject 6 Units into the skin 2 (two) times daily with a meal.   Insulin  Syringe-Needle U-100 29G X 1/2 0.3 ML Misc 1 application  by Does not apply route 2 (two) times daily.   liraglutide 18 MG/3ML Sopn Commonly known as: VICTOZA PLEASE SEE ATTACHED FOR DETAILED DIRECTIONS   lisinopril  20 MG tablet Commonly known as: ZESTRIL  Take 1 tablet (20 mg total) by mouth daily.   metFORMIN 500 MG tablet Commonly known as: GLUCOPHAGE Take 500 mg by mouth 2 (two) times daily.   metoprolol  succinate 25 MG 24 hr tablet Commonly known as:  TOPROL -XL Take 25 mg by mouth daily.   pantoprazole 40 MG tablet Commonly known as: PROTONIX Take 40 mg by mouth 2 (two) times daily before a meal.   sildenafil 100 MG tablet Commonly known as: VIAGRA Take 100 mg by mouth as needed for erectile dysfunction.        Follow-up Information     Edward Page. Schedule an appointment as soon as possible for a visit in 1 week(s).   Specialty: Family Medicine Why: Renal function panel in one week Contact information: [address] [place] [address] [phone]         Edward [name], Page. Schedule an appointment as soon as possible for a visit in 1 month(s).   Specialty: Internal Medicine Contact information: [address] [place] [address] [phone]                Discharge Exam: Filed Weights   04/11/24 0524 04/12/24 0442 04/13/24 0524  Weight: (!) 170.6 kg (!) 159.9 kg (!) 163.9 kg   Constitutional: NAD, calm, comfortable Eyes: PERRL, lids and conjunctivae normal ENMT: Mucous membranes are moist. Posterior pharynx clear of any exudate or lesions.Normal dentition.  Neck: normal, supple, no masses, no thyromegaly Respiratory: clear to auscultation bilaterally, no wheezing, no crackles. Normal respiratory effort. No accessory muscle use.  Cardiovascular: Regular rate and rhythm, no murmurs / rubs / gallops. No extremity edema. 2+ pedal pulses. No carotid bruits.  Abdomen: no tenderness, no masses palpated. No hepatosplenomegaly. Bowel sounds positive.  Musculoskeletal: no clubbing / cyanosis. No joint deformity upper and lower extremities. Good ROM, no contractures. Normal muscle tone.  Skin: no rashes, lesions, ulcers. No induration Neurologic: CN 2-12 grossly intact. Sensation intact, DTR normal. Strength 5/5 x all 4 extremities.  Psychiatric: Normal judgment and insight. Alert and oriented x 3. Normal mood.    Condition at discharge: good  The results of significant diagnostics from  this hospitalization (including imaging, microbiology, ancillary and laboratory) are listed below for reference.   Imaging Studies: US  RENAL Result Date: 04/12/2024 EXAM: US  Retroperitoneum Complete, Renal. 04/12/2024 10:49:03 AM TECHNIQUE: Real-time ultrasonography of the retroperitoneum renal was performed. COMPARISON: US  Renal 08/05/2019. CLINICAL HISTORY: Kidney dysfunction. FINDINGS: FINDINGS: RIGHT KIDNEY/URETER: Right kidney measures 15.0 x 8.2 x 8.1 cm. Normal cortical echogenicity. No hydronephrosis. No calculus. No mass. LEFT KIDNEY/URETER: The left kidney was not visualized or evaluated, due to bowel gas and patient's body habitus resulting in limited acoustic windows. BLADDER: Unremarkable appearance of the bladder. IMPRESSION: 1. Left kidney not visualized due to limited acoustic windows. No hydronephrosis or nephrolithiasis in the right kidney. Electronically signed by: Rogelia Myers Page 04/12/2024 12:47 PM EST RP Workstation: [id number]   ECHOCARDIOGRAM COMPLETE Result Date: 04/08/2024    ECHOCARDIOGRAM REPORT   Patient Name:   Edward Page Date of Exam: 04/08/2024 Medical Rec #:  [id number]      Height:       69.0 in Accession #:    [id number]     Weight:       350.0 lb Date  of Birth:  1975-10-23      BSA:          2.621 m Patient Age:    48 years       BP:           172/108 mmHg Patient Gender: M              HR:           80 bpm. Exam Location:  [facility] Procedure: 2D Echo, Cardiac Doppler and Color Doppler (Both Spectral and Color            Flow Doppler were utilized during procedure). Indications:    CHF - Acute Diastolic  History:        Patient has prior history of Echocardiogram examinations, most                 recent 08/04/2019. CHF; Risk Factors:Hypertension and Diabetes.  Sonographer:    Juliene Rucks Referring Phys: [id number] COURAGE EMOKPAE  Sonographer Comments: Patient is obese. IMPRESSIONS  1. Left ventricular ejection fraction, by estimation, is 50 to 55%. The left ventricle  has low normal function. The left ventricle demonstrates global hypokinesis. The left ventricular internal cavity size was mildly dilated. There is mild concentric left ventricular hypertrophy. Left ventricular diastolic parameters are indeterminate.  2. Right ventricular systolic function is normal. The right ventricular size is normal. There is moderately elevated pulmonary artery systolic pressure. The estimated right ventricular systolic pressure is 50.3 mmHg.  3. The mitral valve is grossly normal. Trivial mitral valve regurgitation.  4. The aortic valve is tricuspid. Aortic valve regurgitation is not visualized.  5. The inferior vena cava is normal in size with greater than 50% respiratory variability, suggesting right atrial pressure of 3 mmHg. Comparison(s): Prior images reviewed side by side. LVEF 50-55%, indeterminate diastolic function. Normal RV contraction with moderately elevated estimated RVSP. FINDINGS  Left Ventricle: Left ventricular ejection fraction, by estimation, is 50 to 55%. The left ventricle has low normal function. The left ventricle demonstrates global hypokinesis. The left ventricular internal cavity size was mildly dilated. There is mild concentric left ventricular hypertrophy. Left ventricular diastolic parameters are indeterminate. Right Ventricle: The right ventricular size is normal. No increase in right ventricular wall thickness. Right ventricular systolic function is normal. There is moderately elevated pulmonary artery systolic pressure. The tricuspid regurgitant velocity is 3.44 m/s, and with an assumed right atrial pressure of 3 mmHg, the estimated right ventricular systolic pressure is 50.3 mmHg. Left Atrium: Left atrial size was normal in size. Right Atrium: Right atrial size was normal in size. Pericardium: There is no evidence of pericardial effusion. Mitral Valve: The mitral valve is grossly normal. Trivial mitral valve regurgitation. Tricuspid Valve: The tricuspid valve  is grossly normal. Tricuspid valve regurgitation is mild. Aortic Valve: The aortic valve is tricuspid. Aortic valve regurgitation is not visualized. Pulmonic Valve: The pulmonic valve was grossly normal. Pulmonic valve regurgitation is trivial. Aorta: The aortic root is normal in size and structure. Venous: The inferior vena cava is normal in size with greater than 50% respiratory variability, suggesting right atrial pressure of 3 mmHg. IAS/Shunts: No atrial level shunt detected by color flow Doppler. Additional Comments: 3D was performed not requiring image post processing on an independent workstation and was indeterminate.  LEFT VENTRICLE PLAX 2D LVIDd:         5.80 cm      Diastology LVIDs:         4.10 cm  LV e' lateral:   7.83 cm/s LV PW:         1.20 cm      LV E/e' lateral: 13.9 LV IVS:        1.20 cm LVOT diam:     2.30 cm LV SV:         80 LV SV Index:   31 LVOT Area:     4.15 cm  LV Volumes (MOD) LV vol d, MOD A4C: 262.0 ml LV vol s, MOD A4C: 143.0 ml LV SV MOD A4C:     262.0 ml RIGHT VENTRICLE             IVC RV S prime:     12.02 cm/s  IVC diam: 2.00 cm TAPSE (M-mode): 2.3 cm LEFT ATRIUM           Index        RIGHT ATRIUM           Index LA diam:      4.30 cm 1.64 cm/m   RA Area:     12.90 cm LA Vol (A4C): 65.7 ml 25.07 ml/m  RA Volume:   27.30 ml  10.42 ml/m  AORTIC VALVE LVOT Vmax:   102.00 cm/s LVOT Vmean:  67.900 cm/s LVOT VTI:    0.193 m  AORTA Ao Root diam: 3.40 cm Ao Asc diam:  2.90 cm MITRAL VALVE                TRICUSPID VALVE MV Area (PHT): 4.36 cm     TR Peak grad:   47.3 mmHg MV Decel Time: 174 msec     TR Vmax:        344.00 cm/s MV E velocity: 109.00 cm/s MV A velocity: 78.50 cm/s   SHUNTS MV E/A ratio:  1.39         Systemic VTI:  0.19 m                             Systemic Diam: 2.30 cm Jayson Sierras Page Electronically signed by Jayson Sierras Page Signature Date/Time: 04/08/2024/5:10:32 PM    Final    DG Chest Port 1 View Result Date: 04/08/2024 EXAM: 1 VIEW(S) XRAY OF  THE CHEST 04/08/2024 05:46:00 AM COMPARISON: 08/04/2019 CLINICAL HISTORY: sob FINDINGS: LUNGS AND PLEURA: Low lung volumes. Probable calcified granuloma right mid lung zone. Patchy airspace opacity right mid lung zone. No pleural effusion. No pneumothorax. HEART AND MEDIASTINUM: Cardiomegaly. BONES AND SOFT TISSUES: No acute osseous abnormality. IMPRESSION: 1. Patchy airspace opacity in the right mid lung zone. If there are clinical signs or symptoms of this is compatible with infection pneumonia. In the absence of any clinical signs or symptoms of pneumonia, this is indeterminate and further investigation with CT of the chest would be advised. 2. Cardiomegaly. Electronically signed by: Waddell Calk Page 04/08/2024 06:01 AM EST RP Workstation: [id number]    Microbiology: Results for orders placed or performed during the hospital encounter of 04/08/24  Resp panel by RT-PCR (RSV, Flu A&B, Covid) Anterior Nasal Swab     Status: None   Collection Time: 04/08/24  4:53 AM   Specimen: Anterior Nasal Swab  Result Value Ref Range Status   SARS Coronavirus 2 by RT PCR NEGATIVE NEGATIVE Final    Comment: (NOTE) SARS-CoV-2 target nucleic acids are NOT DETECTED.  The SARS-CoV-2 RNA is generally detectable in upper respiratory specimens during the acute phase of infection. The lowest concentration  of SARS-CoV-2 viral copies this assay can detect is 138 copies/mL. A negative result does not preclude SARS-Cov-2 infection and should not be used as the sole basis for treatment or other patient management decisions. A negative result may occur with  improper specimen collection/handling, submission of specimen other than nasopharyngeal swab, presence of viral mutation(s) within the areas targeted by this assay, and inadequate number of viral copies(<138 copies/mL). A negative result must be combined with clinical observations, patient history, and epidemiological information. The expected result is  Negative.  Fact Sheet for Patients:  bloggercourse.com  Fact Sheet for Healthcare Providers:  seriousbroker.it  This test is no t yet approved or cleared by the [place]  FDA and  has been authorized for detection and/or diagnosis of SARS-CoV-2 by FDA under an Emergency Use Authorization (EUA). This EUA will remain  in effect (meaning this test can be used) for the duration of the COVID-19 declaration under Section 564(b)(1) of the Act, 21 U.S.C.section 360bbb-3(b)(1), unless the authorization is terminated  or revoked sooner.       Influenza A by PCR NEGATIVE NEGATIVE Final   Influenza B by PCR NEGATIVE NEGATIVE Final    Comment: (NOTE) The Xpert Xpress SARS-CoV-2/FLU/RSV plus assay is intended as an aid in the diagnosis of influenza from Nasopharyngeal swab specimens and should not be used as a sole basis for treatment. Nasal washings and aspirates are unacceptable for Xpert Xpress SARS-CoV-2/FLU/RSV testing.  Fact Sheet for Patients: bloggercourse.com  Fact Sheet for Healthcare Providers: seriousbroker.it  This test is not yet approved or cleared by the [place]  FDA and has been authorized for detection and/or diagnosis of SARS-CoV-2 by FDA under an Emergency Use Authorization (EUA). This EUA will remain in effect (meaning this test can be used) for the duration of the COVID-19 declaration under Section 564(b)(1) of the Act, 21 U.S.C. section 360bbb-3(b)(1), unless the authorization is terminated or revoked.     Resp Syncytial Virus by PCR NEGATIVE NEGATIVE Final    Comment: (NOTE) Fact Sheet for Patients: bloggercourse.com  Fact Sheet for Healthcare Providers: seriousbroker.it  This test is not yet approved or cleared by the [place]  FDA and has been authorized for detection and/or diagnosis of  SARS-CoV-2 by FDA under an Emergency Use Authorization (EUA). This EUA will remain in effect (meaning this test can be used) for the duration of the COVID-19 declaration under Section 564(b)(1) of the Act, 21 U.S.C. section 360bbb-3(b)(1), unless the authorization is terminated or revoked.  Performed at [facility], [address] [place], [place] [address]     Labs: CBC: Recent Labs  Lab 04/08/24 0430 04/09/24 0442 04/10/24 0416  WBC 8.3 6.5 6.5  HGB 9.4* 9.6* 9.4*  HCT 30.2* 30.6* 30.4*  MCV 83.9 84.3 83.7  PLT 224 246 242   Basic Metabolic Panel: Recent Labs  Lab 04/08/24 0430 04/09/24 0442 04/10/24 0416 04/11/24 0545  NA 143 143 142 142  K 4.4 4.3 4.5 4.6  CL 113* 110 109 107  CO2 17* 24 24 28   GLUCOSE 208* 134* 134* 146*  BUN 62* 56* 53* 48*  CREATININE 2.70* 2.69* 2.89* 2.73*  CALCIUM  8.4* 8.7* 8.7* 8.6*  MG  --  1.9 1.8 1.8   Liver Function Tests: Recent Labs  Lab 04/09/24 0442 04/10/24 0416  AST 17 18  ALT 31 26  ALKPHOS 95 92  BILITOT 0.3 0.3  PROT 6.4* 6.2*  ALBUMIN 3.4* 3.1*   CBG: Recent Labs  Lab 04/12/24 0727 04/12/24 1110 04/12/24  1616 04/12/24 2022 04/13/24 0725  GLUCAP 130* 133* 140* 164* 140*    Discharge time spent: 40 minutes.  Signed: Deliliah Room, Page [facility] 04/13/2024 "

## 2024-05-05 ENCOUNTER — Telehealth: Payer: Self-pay | Admitting: Family

## 2024-05-05 NOTE — Progress Notes (Unsigned)
 "  [facility] Note   Referring Physician: 12/25 admission PCP: Edward Clem Pitts, MD Cardiologist: None   Chief Complaint: fatigue   HPI:  [name] is a 49 y/o male with a history of HFrEF, DM, HTN, obesity, nephrotic syndrome  Admitted 04/08/24 with worsening profound lower extremity edema. IV diuresed. Echo 04/08/24: EF 50-55%, mild LVH, normal RV, moderately elevated PA pressure of 50.3 mmHg. Renal ultrasound did not show evidence of obstructive uropathy.   He presents today, with his wife, for his initial TOC HF visit with a chief complaint of fatigue. Has associated bilateral hip pain/ knee pain. + snoring. + apneic episodes (per wife). Denies shortness of breath, chest pain, palpitations, pedal edema, abdominal distention or dizziness. Reports having a good appetite and is weighing himself daily. Weight has been stable since discharge.   Father and p grandfather had MI's. Denies any family members with HF.   Denies tobacco, alcohol, drug use. Has decreased sodium/ fluid intake since getting discharged. Has 4 grandkids that he enjoys being around.    Review of Systems: [y] = yes, [ ]  = no   General: Weight gain [ ] ; Weight loss [ ] ; Anorexia [ ] ; Fatigue davis.dad ]; Fever [ ] ; Chills [ ] ; Weakness [ ]   Cardiac: Chest pain/pressure [ ] ; Resting SOB [ ] ; Exertional SOB [ ] ; Orthopnea [ ] ; Pedal Edema [ ] ; Palpitations [ ] ; Syncope [ ] ; Presyncope [ ] ; Paroxysmal nocturnal dyspnea[ ]   Pulmonary: Cough [ ] ; Wheezing[ ] ; Hemoptysis[ ] ; Sputum [ ] ; Snoring davis.dad ]; Apnea [y] GI: Vomiting[ ] ; Dysphagia[ ] ; Melena[ ] ; Hematochezia [ ] ; Heartburn[ ] ; Abdominal pain [ ] ; Constipation [ ] ; Diarrhea [ ] ; BRBPR [ ]   GU: Hematuria[ ] ; Dysuria [ ] ; Nocturia[ ]   Vascular: Pain in legs with walking [ ] ; Pain in feet with lying flat [ ] ; Non-healing sores [ ] ; Stroke [ ] ; TIA [ ] ; Slurred speech [ ] ;  Neuro: Headaches[ ] ; Vertigo[ ] ; Seizures[ ] ; Paresthesias[ ] ;Blurred vision [ ] ;  Diplopia [ ] ; Vision changes [ ]   Ortho/Skin: Arthritis [ ] ; Joint pain [ y]; Muscle pain [ ] ; Joint swelling [ ] ; Back Pain [ ] ; Rash [ ]   Psych: Depression[ ] ; Anxiety[ ]   Heme: Bleeding problems [ ] ; Clotting disorders [ ] ; Anemia [ ]   Endocrine: Diabetes davis.dad ]; Thyroid dysfunction[ ]    Past Medical History:  Diagnosis Date   CHF (congestive heart failure) (HCC)    Diabetes mellitus without complication (HCC)    Hypertension    Retinal detachment     Current Outpatient Medications  Medication Sig Dispense Refill   amLODipine (NORVASC) 10 MG tablet Take 10 mg by mouth daily.     atorvastatin  (LIPITOR) 40 MG tablet Take 1 tablet (40 mg total) by mouth daily. 30 tablet 0   chlorthalidone (HYGROTON) 25 MG tablet Take 25 mg by mouth daily.     dapagliflozin  propanediol (FARXIGA ) 10 MG TABS tablet Take 1 tablet (10 mg total) by mouth daily. 30 tablet 0   furosemide  (LASIX ) 80 MG tablet Take 1 tablet (80 mg total) by mouth 2 (two) times daily. 60 tablet 0   insulin  NPH-regular Human (70-30) 100 UNIT/ML injection Inject 6 Units into the skin 2 (two) times daily with a meal. 10 mL 1   Insulin  Syringe-Needle U-100 29G X 1/2 0.3 ML MISC 1 application by Does not apply route 2 (two) times daily. 50 each 0   liraglutide (VICTOZA) 18 MG/3ML  SOPN PLEASE SEE ATTACHED FOR DETAILED DIRECTIONS     lisinopril  (ZESTRIL ) 20 MG tablet Take 1 tablet (20 mg total) by mouth daily. 30 tablet 0   metFORMIN (GLUCOPHAGE) 500 MG tablet Take 500 mg by mouth 2 (two) times daily.     metoprolol  succinate (TOPROL -XL) 25 MG 24 hr tablet Take 25 mg by mouth daily.     pantoprazole (PROTONIX) 40 MG tablet Take 40 mg by mouth 2 (two) times daily before a meal.     sildenafil (VIAGRA) 100 MG tablet Take 100 mg by mouth as needed for erectile dysfunction.     No current facility-administered medications for this visit.    Allergies[1]    Social History   Socioeconomic History   Marital status: Married    Spouse  name: Not on file   Number of children: Not on file   Years of education: Not on file   Highest education level: Not on file  Occupational History   Not on file  Tobacco Use   Smoking status: Never   Smokeless tobacco: Never  Vaping Use   Vaping status: Never Used  Substance and Sexual Activity   Alcohol use: Not Currently   Drug use: Not Currently   Sexual activity: Not on file  Other Topics Concern   Not on file  Social History Narrative   Not on file   Social Drivers of Health   Tobacco Use: Low Risk (04/29/2024)   Received from [facility]   Patient History    Smoking Tobacco Use: Never    Smokeless Tobacco Use: Never    Passive Exposure: Not on file  Financial Resource Strain: Not on file  Food Insecurity: No Food Insecurity (04/08/2024)   Epic    Worried About Programme Researcher, Broadcasting/film/video in the Last Year: Never true    Ran Out of Food in the Last Year: Never true  Transportation Needs: No Transportation Needs (04/08/2024)   Epic    Lack of Transportation (Medical): No    Lack of Transportation (Non-Medical): No  Physical Activity: Not on file  Stress: Not on file  Social Connections: Moderately Integrated (04/08/2024)   Social Connection and Isolation Panel    Frequency of Communication with Friends and Family: Once a week    Frequency of Social Gatherings with Friends and Family: Once a week    Attends Religious Services: 1 to 4 times per year    Active Member of [employer] or Organizations: Yes    Attends Banker Meetings: 1 to 4 times per year    Marital Status: Married  Catering Manager Violence: Not At Risk (04/08/2024)   Epic    Fear of Current or Ex-Partner: No    Emotionally Abused: No    Physically Abused: No    Sexually Abused: No  Depression (PHQ2-9): Not on file  Alcohol Screen: Not on file  Housing: Low Risk (04/08/2024)   Epic    Unable to Pay for Housing in the Last Year: No    Number of Times Moved in the Last Year: 0    Homeless in  the Last Year: No  Utilities: Not At Risk (04/08/2024)   Epic    Threatened with loss of utilities: No  Health Literacy: Not on file     No family history on file.  Vitals:   05/06/24 1146  BP: (!) 148/56  Pulse: 70  SpO2: 99%  Weight: (!) 354 lb (160.6 kg)   Wt Readings from Last  3 Encounters:  05/06/24 (!) 354 lb (160.6 kg)  04/13/24 (!) 361 lb 5.3 oz (163.9 kg)  08/11/19 (!) 306 lb 6.4 oz (139 kg)   Lab Results  Component Value Date   CREATININE 2.73 (H) 04/11/2024   CREATININE 2.89 (H) 04/10/2024   CREATININE 2.69 (H) 04/09/2024    PHYSICAL EXAM:  General: Well appearing.  Cor: No JVD. Regular rhythm, rate.  Lungs: clear Abdomen: soft, nontender, nondistended. Extremities: trace pitting edema bilateral lower legs Neuro:. Affect pleasant   ECG: NSR with PVC's (trigeminy) HR 81 (personally reviewed and unchanged)   ASSESSMENT & PLAN:  1: HFpEF- - suspect due to uncontrolled HTN, ? OSA - NYHA class II - euvolemic - weighing daily. Reviewed parameters to call about - Echo 04/08/24: EF 50-55%, mild LVH, normal RV, moderately elevated PA pressure of 50.3 mmHg. Could consider cMRI as he has joint pain, CKD. No family history of HF that he's aware of.  - GDMT limited by CKD - continue farxiga  10mg  daily. RN reaching out to HF pharmacy to see about grant as he says that it's too expensive. - continue lasix  80mg  BID - continue lisinopril  20mg  daily - continue toprol  25mg  daily - unable to use MRA due to CKD - EKG today shows PVC's / trigeminy. Could consider zio although patient is currently asymptomatic - proBNP 04/08/24 was 4315.0  2: HTN- - BP 148/56 - continue amlodipine, chlorthalidone, lisinopril , toprol   - BMET 04/29/24 reviewed: K 5.4, creatinine 3.89, GFR 18  3: T2DM (managed by PCP)- - A1c 04/08/24 was 7%  4: Obesity- - BMI 52.28 kg/ m2 - will check and see if insurance covers ozempic or mounjaro  5: CKD- - saw nephrology 01/26 who stopped  chlorthalidone and meformin   6: Snoring- - Itamar sleep study provided to patient today to r/o sleep apnea.   Return in 1 month, sooner if needed  I spent 54 minutes reviewing records, interviewing/ examing patient and managing plan/ orders.   Ellouise DELENA Class, FNP 05/05/2024     [1] No Known Allergies  "

## 2024-05-05 NOTE — Telephone Encounter (Signed)
 Called to confirm/remind patient of their appointment at the [facility] on 05/06/24.   Appointment:   [x] Confirmed  [] Left mess   [] No answer/No voice mail  [] VM Full/unable to leave message  [] Phone not in service  Patient reminded to bring all medications and/or complete list.  Confirmed patient has transportation. Gave directions, instructed to utilize valet parking.

## 2024-05-06 ENCOUNTER — Ambulatory Visit: Attending: Family | Admitting: Family

## 2024-05-06 ENCOUNTER — Encounter: Payer: Self-pay | Admitting: Family

## 2024-05-06 ENCOUNTER — Other Ambulatory Visit (HOSPITAL_COMMUNITY): Payer: Self-pay

## 2024-05-06 VITALS — BP 148/56 | HR 70 | Wt 354.0 lb

## 2024-05-06 DIAGNOSIS — I10 Essential (primary) hypertension: Secondary | ICD-10-CM

## 2024-05-06 DIAGNOSIS — R5383 Other fatigue: Secondary | ICD-10-CM | POA: Insufficient documentation

## 2024-05-06 DIAGNOSIS — I130 Hypertensive heart and chronic kidney disease with heart failure and stage 1 through stage 4 chronic kidney disease, or unspecified chronic kidney disease: Secondary | ICD-10-CM | POA: Insufficient documentation

## 2024-05-06 DIAGNOSIS — I493 Ventricular premature depolarization: Secondary | ICD-10-CM | POA: Insufficient documentation

## 2024-05-06 DIAGNOSIS — I5032 Chronic diastolic (congestive) heart failure: Secondary | ICD-10-CM | POA: Insufficient documentation

## 2024-05-06 DIAGNOSIS — R0681 Apnea, not elsewhere classified: Secondary | ICD-10-CM | POA: Insufficient documentation

## 2024-05-06 DIAGNOSIS — Z794 Long term (current) use of insulin: Secondary | ICD-10-CM | POA: Insufficient documentation

## 2024-05-06 DIAGNOSIS — N189 Chronic kidney disease, unspecified: Secondary | ICD-10-CM | POA: Insufficient documentation

## 2024-05-06 DIAGNOSIS — E669 Obesity, unspecified: Secondary | ICD-10-CM | POA: Insufficient documentation

## 2024-05-06 DIAGNOSIS — M25552 Pain in left hip: Secondary | ICD-10-CM | POA: Insufficient documentation

## 2024-05-06 DIAGNOSIS — M25562 Pain in left knee: Secondary | ICD-10-CM | POA: Insufficient documentation

## 2024-05-06 DIAGNOSIS — M25551 Pain in right hip: Secondary | ICD-10-CM | POA: Insufficient documentation

## 2024-05-06 DIAGNOSIS — R0683 Snoring: Secondary | ICD-10-CM | POA: Insufficient documentation

## 2024-05-06 DIAGNOSIS — M25561 Pain in right knee: Secondary | ICD-10-CM | POA: Insufficient documentation

## 2024-05-06 DIAGNOSIS — E66813 Obesity, class 3: Secondary | ICD-10-CM

## 2024-05-06 DIAGNOSIS — Z6841 Body Mass Index (BMI) 40.0 and over, adult: Secondary | ICD-10-CM | POA: Insufficient documentation

## 2024-05-06 DIAGNOSIS — N184 Chronic kidney disease, stage 4 (severe): Secondary | ICD-10-CM

## 2024-05-06 DIAGNOSIS — E1122 Type 2 diabetes mellitus with diabetic chronic kidney disease: Secondary | ICD-10-CM | POA: Insufficient documentation

## 2024-05-06 DIAGNOSIS — Z7984 Long term (current) use of oral hypoglycemic drugs: Secondary | ICD-10-CM | POA: Insufficient documentation

## 2024-05-06 DIAGNOSIS — Z79899 Other long term (current) drug therapy: Secondary | ICD-10-CM | POA: Insufficient documentation

## 2024-05-06 DIAGNOSIS — R008 Other abnormalities of heart beat: Secondary | ICD-10-CM | POA: Insufficient documentation

## 2024-05-06 NOTE — Progress Notes (Signed)
 Patient Name:         DOB:       Height:     Weight:  Office Name:         Referring Provider:  Today's Date:  Date:   STOP BANG RISK ASSESSMENT S (snore) Have you been told that you snore?     YES   T (tired) Are you often tired, fatigued, or sleepy during the day?   YES  O (obstruction) Do you stop breathing, choke, or gasp during sleep? NO   P (pressure) Do you have or are you being treated for high blood pressure? YES   B (BMI) Is your body index greater than 35 kg/m? YES   A (age) Are you 49 years old or older? YES   N (neck) Do you have a neck circumference greater than 16 inches?   NO   G (gender) Are you a male? YES   TOTAL STOP/BANG YES ANSWERS 6                                                                       For Office Use Only              Procedure Order Form    YES to 3+ Stop Bang questions OR two clinical symptoms - patient qualifies for WatchPAT (CPT 95800)             Clinical Notes: Will consult Sleep Specialist and refer for management of therapy due to patient increased risk of Sleep Apnea. Ordering a sleep study due to the following two clinical symptoms: Excessive daytime sleepiness G47.10 / History of high blood pressure R03.0    I understand that I am proceeding with a home sleep apnea test as ordered by my treating physician. I understand that untreated sleep apnea is a serious cardiovascular risk factor and it is my responsibility to perform the test and seek management for sleep apnea. I will be contacted with the results and be managed for sleep apnea by a local sleep physician. I will be receiving equipment and further instructions from [facility]. I shall promptly ship back the equipment via the included mailing label. I understand my insurance will be billed for the test and as the patient I am responsible for any insurance related out-of-pocket costs incurred. I have been provided with written instructions and can call for additional  video or telephonic instruction, with 24-hour availability of qualified personnel to answer any questions: Patient Help Desk [phone].  Patient Signature ______________________________________________________   Date______________________ Patient Telemedicine Verbal Consent

## 2024-05-06 NOTE — Patient Instructions (Signed)
 Continue weighing daily and call for an overnight weight gain of 3 pounds or more or a weekly weight gain of more than 5 pounds.   [email or web address]   Medication Changes:  No medication changes today!  Lab Work:  No labs needed today!    Testing/Procedures:  Your provider has recommended that you have a home sleep study (Itamar Test).  We have provided you with the equipment in our office today. Please go ahead and download the app. DO NOT OPEN OR TAMPER WITH THE BOX UNTIL WE ADVISE YOU TO DO SO. Once insurance has approved the test our office will call you with PIN number and approval to proceed with testing. Once you have completed the test you just dispose of the equipment, the information is automatically uploaded to us  via blue-tooth technology. If your test is positive for sleep apnea and you need a home CPAP machine you will be contacted by Dr Dorine office [facility]) to set this up.  Your pin number is [id number]   Follow-Up in: Please follow up with the [facility] in 1 month with Ellouise Class, FNP.   Thank you for choosing [facility].    At the [facility], you and your health needs are our priority. We have a designated team specialized in the treatment of Heart Failure. This Care Team includes your primary Heart Failure Specialized Cardiologist (physician), Advanced Practice Providers (APPs- Physician Assistants and Nurse Practitioners), and Pharmacist who all work together to provide you with the care you need, when you need it.   You may see any of the following providers on your designated Care Team at your next follow up:  Dr. Toribio Fuel Dr. Ezra Shuck Dr. Ria Commander Dr. Morene Brownie Ellouise Class, FNP Jaun Bash, RPH-CPP  Please be sure to bring in all your medications bottles to every appointment.   Need to Contact Us :  If you have any  questions or concerns before your next appointment please send us  a message through [place] or call our office at [phone].    TO LEAVE A MESSAGE FOR THE NURSE SELECT OPTION 2, PLEASE LEAVE A MESSAGE INCLUDING: YOUR NAME DATE OF BIRTH CALL BACK NUMBER REASON FOR CALL**this is important as we prioritize the call backs  YOU WILL RECEIVE A CALL BACK THE SAME DAY AS LONG AS YOU CALL BEFORE 4:00 PM

## 2024-05-06 NOTE — Progress Notes (Signed)
 ITAMAR home sleep study given to patient, all instructions explained, waiver signed, and CLOUDPAT registration complete.

## 2024-06-03 ENCOUNTER — Ambulatory Visit: Admitting: Family
